# Patient Record
Sex: Female | Born: 1950 | Race: White | Hispanic: No | Marital: Married | Smoking: Never smoker
Health system: Southern US, Community
[De-identification: ages and names within clinical notes are randomized; demographics above are authoritative.]

## PROBLEM LIST (undated history)

## (undated) DIAGNOSIS — N3946 Mixed incontinence: Secondary | ICD-10-CM

## (undated) DIAGNOSIS — I10 Essential (primary) hypertension: Secondary | ICD-10-CM

## (undated) DIAGNOSIS — C439 Malignant melanoma of skin, unspecified: Secondary | ICD-10-CM

## (undated) DIAGNOSIS — K219 Gastro-esophageal reflux disease without esophagitis: Secondary | ICD-10-CM

## (undated) DIAGNOSIS — M8580 Other specified disorders of bone density and structure, unspecified site: Secondary | ICD-10-CM

## (undated) DIAGNOSIS — D229 Melanocytic nevi, unspecified: Secondary | ICD-10-CM

## (undated) HISTORY — DX: Essential (primary) hypertension: I10

## (undated) HISTORY — DX: Other specified disorders of bone density and structure, unspecified site: M85.80

## (undated) HISTORY — DX: Melanocytic nevi, unspecified: D22.9

## (undated) HISTORY — DX: Gastro-esophageal reflux disease without esophagitis: K21.9

## (undated) HISTORY — PX: TUBAL LIGATION: SHX77

## (undated) HISTORY — DX: Malignant melanoma of skin, unspecified: C43.9

## (undated) HISTORY — DX: Mixed incontinence: N39.46

---
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
## 1996-05-19 DIAGNOSIS — C439 Malignant melanoma of skin, unspecified: Secondary | ICD-10-CM

## 1996-05-19 HISTORY — PX: OTHER SURGICAL HISTORY: SHX169

## 1996-05-19 HISTORY — DX: Malignant melanoma of skin, unspecified: C43.9

## 2003-06-21 ENCOUNTER — Ambulatory Visit (HOSPITAL_COMMUNITY): Admission: RE | Admit: 2003-06-21 | Discharge: 2003-06-21 | Payer: Self-pay | Admitting: General Surgery

## 2004-01-31 ENCOUNTER — Encounter: Admission: RE | Admit: 2004-01-31 | Discharge: 2004-01-31 | Payer: Self-pay | Admitting: General Surgery

## 2005-02-19 ENCOUNTER — Encounter: Admission: RE | Admit: 2005-02-19 | Discharge: 2005-02-19 | Payer: Self-pay | Admitting: Obstetrics and Gynecology

## 2006-02-25 ENCOUNTER — Encounter: Admission: RE | Admit: 2006-02-25 | Discharge: 2006-02-25 | Payer: Self-pay | Admitting: General Surgery

## 2006-06-03 ENCOUNTER — Other Ambulatory Visit: Admission: RE | Admit: 2006-06-03 | Discharge: 2006-06-03 | Payer: Self-pay | Admitting: Obstetrics & Gynecology

## 2007-03-10 ENCOUNTER — Encounter: Admission: RE | Admit: 2007-03-10 | Discharge: 2007-03-10 | Payer: Self-pay | Admitting: Obstetrics & Gynecology

## 2007-07-07 ENCOUNTER — Other Ambulatory Visit: Admission: RE | Admit: 2007-07-07 | Discharge: 2007-07-07 | Payer: Self-pay | Admitting: Obstetrics & Gynecology

## 2008-03-15 ENCOUNTER — Encounter: Admission: RE | Admit: 2008-03-15 | Discharge: 2008-03-15 | Payer: Self-pay | Admitting: Obstetrics & Gynecology

## 2008-03-22 ENCOUNTER — Encounter: Admission: RE | Admit: 2008-03-22 | Discharge: 2008-03-22 | Payer: Self-pay | Admitting: Obstetrics & Gynecology

## 2008-03-29 ENCOUNTER — Encounter: Admission: RE | Admit: 2008-03-29 | Discharge: 2008-03-29 | Payer: Self-pay | Admitting: Obstetrics & Gynecology

## 2008-07-12 ENCOUNTER — Other Ambulatory Visit: Admission: RE | Admit: 2008-07-12 | Discharge: 2008-07-12 | Payer: Self-pay | Admitting: Obstetrics and Gynecology

## 2009-04-18 ENCOUNTER — Encounter: Admission: RE | Admit: 2009-04-18 | Discharge: 2009-04-18 | Payer: Self-pay | Admitting: Obstetrics & Gynecology

## 2009-05-19 HISTORY — PX: BREAST BIOPSY: SHX20

## 2009-07-04 ENCOUNTER — Encounter: Admission: RE | Admit: 2009-07-04 | Discharge: 2009-07-04 | Payer: Self-pay | Admitting: Obstetrics & Gynecology

## 2010-03-24 ENCOUNTER — Encounter: Admission: RE | Admit: 2010-03-24 | Discharge: 2010-03-24 | Payer: Self-pay | Admitting: Orthopedic Surgery

## 2010-06-08 ENCOUNTER — Other Ambulatory Visit: Payer: Self-pay | Admitting: Obstetrics & Gynecology

## 2010-06-08 DIAGNOSIS — Z1239 Encounter for other screening for malignant neoplasm of breast: Secondary | ICD-10-CM

## 2010-07-10 ENCOUNTER — Ambulatory Visit
Admission: RE | Admit: 2010-07-10 | Discharge: 2010-07-10 | Disposition: A | Discharge status: Home / Self Care | Payer: BC Managed Care – PPO | Source: Ambulatory Visit | Attending: Obstetrics & Gynecology | Admitting: Obstetrics & Gynecology

## 2010-07-10 DIAGNOSIS — Z1239 Encounter for other screening for malignant neoplasm of breast: Secondary | ICD-10-CM

## 2010-12-18 ENCOUNTER — Other Ambulatory Visit: Payer: Self-pay | Admitting: Dermatology

## 2011-05-20 LAB — HM PAP SMEAR: HM Pap smear: NORMAL

## 2011-10-09 ENCOUNTER — Other Ambulatory Visit: Payer: Self-pay | Admitting: Obstetrics & Gynecology

## 2011-10-09 DIAGNOSIS — Z1231 Encounter for screening mammogram for malignant neoplasm of breast: Secondary | ICD-10-CM

## 2011-11-26 ENCOUNTER — Ambulatory Visit
Admission: RE | Admit: 2011-11-26 | Discharge: 2011-11-26 | Disposition: A | Discharge status: Home / Self Care | Payer: BC Managed Care – PPO | Source: Ambulatory Visit | Attending: Obstetrics & Gynecology | Admitting: Obstetrics & Gynecology

## 2011-11-26 DIAGNOSIS — Z1231 Encounter for screening mammogram for malignant neoplasm of breast: Secondary | ICD-10-CM

## 2011-11-26 LAB — HM MAMMOGRAPHY: HM Mammogram: NORMAL

## 2012-08-23 ENCOUNTER — Encounter: Payer: Self-pay | Admitting: Internal Medicine

## 2012-08-23 ENCOUNTER — Ambulatory Visit (INDEPENDENT_AMBULATORY_CARE_PROVIDER_SITE_OTHER): Payer: BC Managed Care – PPO

## 2012-08-23 ENCOUNTER — Ambulatory Visit (INDEPENDENT_AMBULATORY_CARE_PROVIDER_SITE_OTHER)
Admission: RE | Admit: 2012-08-23 | Discharge: 2012-08-23 | Disposition: A | Discharge status: Home / Self Care | Payer: BC Managed Care – PPO | Source: Ambulatory Visit | Attending: Internal Medicine | Admitting: Internal Medicine

## 2012-08-23 ENCOUNTER — Ambulatory Visit (INDEPENDENT_AMBULATORY_CARE_PROVIDER_SITE_OTHER): Payer: BC Managed Care – PPO | Admitting: Internal Medicine

## 2012-08-23 VITALS — BP 126/70 | HR 83 | Temp 97.6°F | Resp 16 | Ht 63.0 in | Wt 132.0 lb

## 2012-08-23 DIAGNOSIS — Z0000 Encounter for general adult medical examination without abnormal findings: Secondary | ICD-10-CM | POA: Insufficient documentation

## 2012-08-23 DIAGNOSIS — R05 Cough: Secondary | ICD-10-CM

## 2012-08-23 DIAGNOSIS — K219 Gastro-esophageal reflux disease without esophagitis: Secondary | ICD-10-CM | POA: Insufficient documentation

## 2012-08-23 DIAGNOSIS — R059 Cough, unspecified: Secondary | ICD-10-CM

## 2012-08-23 LAB — COMPREHENSIVE METABOLIC PANEL
ALT: 15 U/L (ref 0–35)
AST: 15 U/L (ref 0–37)
Albumin: 4.2 g/dL (ref 3.5–5.2)
Alkaline Phosphatase: 44 U/L (ref 39–117)
BUN: 17 mg/dL (ref 6–23)
CO2: 26 mEq/L (ref 19–32)
Calcium: 9.1 mg/dL (ref 8.4–10.5)
Chloride: 105 mEq/L (ref 96–112)
Creatinine, Ser: 0.7 mg/dL (ref 0.4–1.2)
GFR: 97.51 mL/min (ref >60.00)
Glucose, Bld: 95 mg/dL (ref 70–99)
Potassium: 4 mEq/L (ref 3.5–5.1)
Sodium: 140 mEq/L (ref 135–145)
Total Bilirubin: 0.6 mg/dL (ref 0.3–1.2)
Total Protein: 7.6 g/dL (ref 6.0–8.3)

## 2012-08-23 LAB — URINALYSIS, ROUTINE W REFLEX MICROSCOPIC
Bilirubin Urine: NEGATIVE
Ketones, ur: NEGATIVE
Leukocytes, UA: NEGATIVE
Nitrite: NEGATIVE
Specific Gravity, Urine: 1.01 (ref 1.000–1.030)
Total Protein, Urine: NEGATIVE
Urine Glucose: NEGATIVE
Urobilinogen, UA: 0.2 (ref 0.0–1.0)
pH: 6 (ref 5.0–8.0)

## 2012-08-23 LAB — CBC WITH DIFFERENTIAL/PLATELET
Basophils Absolute: 0.1 10*3/uL (ref 0.0–0.1)
Basophils Relative: 1.2 % (ref 0.0–3.0)
Eosinophils Absolute: 0.2 10*3/uL (ref 0.0–0.7)
Eosinophils Relative: 3.3 % (ref 0.0–5.0)
HCT: 41.8 % (ref 36.0–46.0)
Hemoglobin: 14.6 g/dL (ref 12.0–15.0)
Lymphocytes Relative: 30.6 % (ref 12.0–46.0)
Lymphs Abs: 2.1 10*3/uL (ref 0.7–4.0)
MCHC: 35 g/dL (ref 30.0–36.0)
MCV: 89.4 fl (ref 78.0–100.0)
Monocytes Absolute: 0.7 10*3/uL (ref 0.1–1.0)
Monocytes Relative: 10.6 % (ref 3.0–12.0)
Neutro Abs: 3.8 10*3/uL (ref 1.4–7.7)
Neutrophils Relative %: 54.3 % (ref 43.0–77.0)
Platelets: 245 10*3/uL (ref 150.0–400.0)
RBC: 4.68 Mil/uL (ref 3.87–5.11)
RDW: 12.7 % (ref 11.5–14.6)
WBC: 7 10*3/uL (ref 4.5–10.5)

## 2012-08-23 LAB — LIPID PANEL
Cholesterol: 213 mg/dL — ABNORMAL HIGH (ref 0–200)
HDL: 56.5 mg/dL (ref >39.00)
Total CHOL/HDL Ratio: 4
Triglycerides: 132 mg/dL (ref 0.0–149.0)
VLDL: 26.4 mg/dL (ref 0.0–40.0)

## 2012-08-23 LAB — TSH: TSH: 1.52 u[IU]/mL (ref 0.35–5.50)

## 2012-08-23 MED ORDER — DEXLANSOPRAZOLE 60 MG PO CPDR: 60.0000 mg | DELAYED_RELEASE_CAPSULE | Freq: Every day | ORAL | Status: DC

## 2012-08-23 NOTE — Assessment & Plan Note (Signed)
I think this is caused by the GERD Will check her CXR today to look for mass, pna, etc.

## 2012-08-23 NOTE — Assessment & Plan Note (Signed)
She has a significant amount of GERD s/s so I have asked her to start dexilant I have also asked her to see GI to consider upper endoscopy to screen for Barrett's/stricture

## 2012-08-23 NOTE — Patient Instructions (Signed)

## 2012-08-23 NOTE — Progress Notes (Signed)
Subjective:    Patient ID: Jamie Duke, female    DOB: Apr 28, 1951, 62 y.o.   MRN: [id number]  Cough This is a chronic problem. Episode onset: for 4 months. The problem has been unchanged. The problem occurs every few hours. The cough is non-productive. Associated symptoms include heartburn. Pertinent negatives include no chest pain, chills, ear congestion, ear pain, fever, headaches, hemoptysis, myalgias, nasal congestion, postnasal drip, rash, rhinorrhea, sore throat, shortness of breath, sweats, weight loss or wheezing. She has tried nothing for the symptoms.  Gastrophageal Reflux She complains of coughing, heartburn, a hoarse voice and water brash. She reports no abdominal pain, no belching, no chest pain, no choking, no dysphagia, no early satiety, no globus sensation, no nausea, no sore throat, no stridor, no tooth decay or no wheezing. This is a chronic problem. The current episode started more than 1 year ago. The problem occurs constantly. The problem has been gradually worsening. The heartburn duration is several minutes. The heartburn is located in the substernum. The heartburn is of moderate intensity. The heartburn does not wake her from sleep. The heartburn does not limit her activity. The heartburn doesn't change with position. Pertinent negatives include no fatigue or weight loss. There are no known risk factors. She has tried an antacid (tums) for the symptoms. The treatment provided moderate relief.      Review of Systems  Constitutional: Negative.  Negative for fever, chills, weight loss, diaphoresis, activity change, appetite change, fatigue and unexpected weight change.  HENT: Positive for hoarse voice. Negative for ear pain, sore throat, rhinorrhea and postnasal drip.   Eyes: Negative.   Respiratory: Positive for cough. Negative for apnea, hemoptysis, choking, chest tightness, shortness of breath, wheezing and stridor.   Cardiovascular: Negative.  Negative for chest pain,  palpitations and leg swelling.  Gastrointestinal: Positive for heartburn. Negative for dysphagia, nausea, vomiting, abdominal pain, diarrhea, constipation, blood in stool, abdominal distention, anal bleeding and rectal pain.  Endocrine: Negative.   Genitourinary: Negative.   Musculoskeletal: Negative.  Negative for myalgias, back pain, joint swelling, arthralgias and gait problem.  Skin: Negative.  Negative for color change, pallor, rash and wound.  Allergic/Immunologic: Negative.   Neurological: Negative.  Negative for headaches.  Hematological: Negative.  Negative for adenopathy. Does not bruise/bleed easily.  Psychiatric/Behavioral: Negative.        Objective:   Physical Exam  Vitals reviewed. Constitutional: She is oriented to person, place, and time. She appears well-developed and well-nourished.  Non-toxic appearance. She does not have a sickly appearance. She does not appear ill. No distress.  HENT:  Head: Normocephalic and atraumatic.  Mouth/Throat: Oropharynx is clear and moist. No oropharyngeal exudate.  Eyes: Conjunctivae are normal. Right eye exhibits no discharge. Left eye exhibits no discharge. No scleral icterus.  Neck: Normal range of motion. Neck supple. Normal carotid pulses, no hepatojugular reflux and no JVD present. No tracheal tenderness present. Carotid bruit is not present. No tracheal deviation present. No mass and no thyromegaly present.  Cardiovascular: Normal rate, regular rhythm, normal heart sounds and intact distal pulses.  Exam reveals no gallop and no friction rub.   No murmur heard. Pulmonary/Chest: Effort normal and breath sounds normal. No stridor. No respiratory distress. She has no wheezes. She has no rales. She exhibits no tenderness.  Abdominal: Soft. Bowel sounds are normal. She exhibits no distension and no mass. There is no tenderness. There is no rebound and no guarding.  Musculoskeletal: Normal range of motion. She exhibits no edema and no  tenderness.  Lymphadenopathy:    She has no cervical adenopathy.  Neurological: She is oriented to person, place, and time.  Skin: Skin is warm and dry. No rash noted. She is not diaphoretic. No erythema. No pallor.  Psychiatric: She has a normal mood and affect. Her behavior is normal. Judgment and thought content normal.     No results found for this basename: WBC, HGB, HCT, PLT, GLUCOSE, CHOL, TRIG, HDL, LDLDIRECT, LDLCALC, ALT, AST, NA, K, CL, CREATININE, BUN, CO2, TSH, PSA, INR, GLUF, HGBA1C, MICROALBUR       Assessment & Plan:

## 2012-08-23 NOTE — Assessment & Plan Note (Signed)
She was referred to GI for a colonoscopy Exam done Vaccines were reviewed Labs ordered Pt ed material was given

## 2012-08-24 ENCOUNTER — Encounter: Payer: Self-pay | Admitting: Internal Medicine

## 2012-08-24 LAB — LDL CHOLESTEROL, DIRECT: Direct LDL: 128 mg/dL

## 2012-08-25 ENCOUNTER — Ambulatory Visit: Payer: BC Managed Care – PPO | Admitting: Internal Medicine

## 2012-08-31 ENCOUNTER — Encounter: Payer: Self-pay | Admitting: Internal Medicine

## 2012-09-29 ENCOUNTER — Ambulatory Visit (INDEPENDENT_AMBULATORY_CARE_PROVIDER_SITE_OTHER): Payer: BC Managed Care – PPO | Admitting: Internal Medicine

## 2012-09-29 ENCOUNTER — Encounter: Payer: Self-pay | Admitting: Internal Medicine

## 2012-09-29 VITALS — BP 130/80 | HR 72 | Ht 63.0 in | Wt 129.8 lb

## 2012-09-29 DIAGNOSIS — R059 Cough, unspecified: Secondary | ICD-10-CM

## 2012-09-29 DIAGNOSIS — J3489 Other specified disorders of nose and nasal sinuses: Secondary | ICD-10-CM

## 2012-09-29 DIAGNOSIS — Z1211 Encounter for screening for malignant neoplasm of colon: Secondary | ICD-10-CM

## 2012-09-29 DIAGNOSIS — R05 Cough: Secondary | ICD-10-CM

## 2012-09-29 DIAGNOSIS — R6889 Other general symptoms and signs: Secondary | ICD-10-CM

## 2012-09-29 DIAGNOSIS — R12 Heartburn: Secondary | ICD-10-CM

## 2012-09-29 MED ORDER — NA SULFATE-K SULFATE-MG SULF 17.5-3.13-1.6 GM/177ML PO SOLN: ORAL | Status: DC

## 2012-09-29 NOTE — Patient Instructions (Addendum)
You have been scheduled for a colonoscopy with propofol. Please follow written instructions given to you at your visit today.  Please pick up your prep kit at the pharmacy within the next 1-3 days. If you use inhalers (even only as needed), please bring them with you on the day of your procedure. Your physician has requested that you go to www.startemmi.com and enter the access code given to you at your visit today. This web site gives a general overview about your procedure. However, you should still follow specific instructions given to you by our office regarding your preparation for the procedure.  We have set you up an appointment to see [facility], Dr. Lazarus Salines on May 28th at 3pm.  They are located at [address], [address], [place].   Thank you for choosing me and  Gastroenterology.  Iva Boop, M.D., [facility]

## 2012-09-30 ENCOUNTER — Encounter: Payer: Self-pay | Admitting: Internal Medicine

## 2012-09-30 NOTE — Progress Notes (Signed)
Subjective:    Patient ID: Jamie Duke, female    DOB: 07/26/1950, 62 y.o.   MRN: [id number]  HPI This very nice lady is here to discuss a chronic cough and throat clearing that is thought possibly to be due to GERD. She recently saw Dr. Sanda Linger, and tells me she thought he was an ENT specialist. She was prescribed Dexilant but it nauseated her so she stopped it.  She has a dry cough that starts after she gets a "tickle". It has been a problem for months to over a year. In fact a year ago she started taking a daily Claritin OTC for allergies and chronic rhinorrhea. It helped but she still has some rhinorrhea. She denies post-nasal drip. No sleep disturbance from symptoms. No sore throat but does have some nasal congestion.  She does not have significant classic GERD sxs like heartburn. No dysphagia.  Does get a lump in her throat sensation.  No Known Allergies Outpatient Prescriptions Prior to Visit  Medication Sig Dispense Refill  . Cholecalciferol (VITAMIN D) 2000 UNITS CAPS Take by mouth 2 (two) times a week.      Marland Kitchen Conj Estrog-Medroxyprogest Ace (PREMPRO PO) Take 0.03 mg by mouth daily.       Marland Kitchen dexlansoprazole (DEXILANT) 60 MG capsule Take 1 capsule (60 mg total) by mouth daily.  90 capsule  3   No facility-administered medications prior to visit.   Past Medical History  Diagnosis Date  . GERD (gastroesophageal reflux disease)    Past Surgical History  Procedure Laterality Date  . Appendectomy  1965  . Tonsillectomy  1955  . Cesarean section    . Tubal ligation     History   Social History  . Marital Status: Married    Spouse Name: N/A    Number of Children: 1       Occupational History  . [employer]   Social History Main Topics  . Smoking status: Never Smoker   . Smokeless tobacco: Never Used  . Alcohol Use: No  . Drug Use: No  . Sexually Active: Not Currently    Family History  Problem Relation Age of Onset  . Heart disease Maternal Uncle    . Hypertension Mother   . Kidney disease Mother   . Lung cancer Father   . Lung cancer Brother    Review of Systems All other ROS negative or as per HPI    Objective:   Physical Exam General:  Well-developed, well-nourished and in no acute distress Eyes:  anicteric. ENT:   Mouth and posterior pharynx free of lesions.  Neck:   supple w/o thyromegaly or mass.  Lungs: Clear to auscultation bilaterally. Heart:  S1S2, no rubs, murmurs, gallops. Abdomen:  soft, non-tender, no hepatosplenomegaly, hernia, or mass and BS+.  Lymph:  no cervical or supraclavicular adenopathy. Extremities:   no edema Skin   no rash. Neuro:  A&O x 3.  Psych:  appropriate mood and  Affect.  Data Reviewed: Internal Medicine note and labs from 08/2012     Assessment & Plan:  Cough - Plan: Ambulatory referral to ENT  Chronic throat clearing - Plan: Ambulatory referral to ENT  Rhinorrhea - Plan: Ambulatory referral to ENT  Heartburn  Special screening for malignant neoplasms, colon - Plan: colonoscopy The risks and benefits as well as alternatives of endoscopic procedure(s) have been discussed and reviewed. All questions answered. The patient agrees to proceed.   1. I think her symptoms may be  allergies and not driven by GERD. Suspect she could have some GERD/heartburn but with the rhinorrhea and other sxs would have an ENT evaluation before any trial of PPI. I explained that might still be reasonable but would await ENT evaluation. Since Claritin has helped some may need more Tx from that direction. I do not think an EGD would help elucidate etiology. 2. Screening colonoscopy is appropriate and she will schedule this. 3. Note that she considers Dr. Fara Chute her PCP and was under the erroneous impression that Dr. Yetta Barre was an ENT provider.  Cc: Sanda Linger, MD and Flo Shanks, MD

## 2012-11-10 ENCOUNTER — Encounter: Payer: Self-pay | Admitting: Internal Medicine

## 2012-11-10 ENCOUNTER — Ambulatory Visit (AMBULATORY_SURGERY_CENTER): Payer: BC Managed Care – PPO | Admitting: Internal Medicine

## 2012-11-10 VITALS — BP 135/89 | HR 71 | Temp 98.8°F | Resp 17 | Ht 63.0 in | Wt 129.0 lb

## 2012-11-10 DIAGNOSIS — Z1211 Encounter for screening for malignant neoplasm of colon: Secondary | ICD-10-CM

## 2012-11-10 MED ORDER — SODIUM CHLORIDE 0.9 % IV SOLN: 500.0000 mL | INTRAVENOUS | Status: DC

## 2012-11-10 NOTE — Op Note (Addendum)
[facility] [address] [place], [address]   COLONOSCOPY PROCEDURE REPORT  PATIENT: Jamie Duke, Jamie Duke  MR#: [id number] BIRTHDATE: [birth year] , 62  yrs. old GENDER: Female ENDOSCOPIST: Iva Boop, MD, [facility] REFERRED [id number] Neita Carp, M.D. PROCEDURE DATE:  11/10/2012 PROCEDURE:   Colonoscopy, screening ASA CLASS:   Class I INDICATIONS:average risk screening and first colonoscopy. MEDICATIONS: propofol (Diprivan) 200mg  IV, MAC sedation, administered by CRNA, and These medications were titrated to patient response per physician's verbal order  DESCRIPTION OF PROCEDURE:   After the risks benefits and alternatives of the procedure were thoroughly explained, informed consent was obtained.  A digital rectal exam revealed no abnormalities of the rectum.   The LB [id number]  endoscope was introduced through the anus and advanced to the cecum, which was identified by both the appendix and ileocecal valve. No adverse events experienced.   The quality of the prep was excellent using Suprep  The instrument was then slowly withdrawn as the colon was fully examined.      COLON FINDINGS: A normal appearing cecum, ileocecal valve, and appendiceal orifice were identified.  The ascending, hepatic flexure, transverse, splenic flexure, descending, sigmoid colon and rectum appeared unremarkable.  No polyps or cancers were seen.   A right colon retroflexion was performed.  Retroflexed views revealed no abnormalities. The time to cecum=3 minutes 13 seconds. Withdrawal time=7 minutes 40 seconds.  The scope was withdrawn and the procedure completed. COMPLICATIONS: There were no complications.  ENDOSCOPIC IMPRESSION: Normal colonoscopy, excellent prep  RECOMMENDATIONS: Repeat Colonscopy in 10 years - 2024   eSigned:  Iva Boop, MD, [facility] 11/10/2012 3:43 PM Revised: 11/10/2012 3:43 PM  cc: Fara Chute, MD, The Patient, and Leda Quail, MD

## 2012-11-10 NOTE — Progress Notes (Signed)
Patient did not experience any of the following events: a burn prior to discharge; a fall within the facility; wrong site/side/patient/procedure/implant event; or a hospital transfer or hospital admission upon discharge from the facility. (G8907) Patient did not have preoperative order for IV antibiotic SSI prophylaxis. (G8918)  

## 2012-11-10 NOTE — Patient Instructions (Addendum)
Repeat colonoscopy in 10 years.  YOU HAD AN ENDOSCOPIC PROCEDURE TODAY AT THE [facility]: Refer to the procedure report that was given to you for any specific questions about what was found during the examination.  If the procedure report does not answer your questions, please call your gastroenterologist to clarify.  If you requested that your care partner not be given the details of your procedure findings, then the procedure report has been included in a sealed envelope for you to review at your convenience later.  YOU SHOULD EXPECT: Some feelings of bloating in the abdomen. Passage of more gas than usual.  Walking can help get rid of the air that was put into your GI tract during the procedure and reduce the bloating. If you had a lower endoscopy (such as a colonoscopy or flexible sigmoidoscopy) you may notice spotting of blood in your stool or on the toilet paper. If you underwent a bowel prep for your procedure, then you may not have a normal bowel movement for a few days.  DIET: Your first meal following the procedure should be a light meal and then it is ok to progress to your normal diet.  A half-sandwich or bowl of soup is an example of a good first meal.  Heavy or fried foods are harder to digest and may make you feel nauseous or bloated.  Likewise meals heavy in dairy and vegetables can cause extra gas to form and this can also increase the bloating.  Drink plenty of fluids but you should avoid alcoholic beverages for 24 hours.  ACTIVITY: Your care partner should take you home directly after the procedure.  You should plan to take it easy, moving slowly for the rest of the day.  You can resume normal activity the day after the procedure however you should NOT DRIVE or use heavy machinery for 24 hours (because of the sedation medicines used during the test).    SYMPTOMS TO REPORT IMMEDIATELY: A gastroenterologist can be reached at any hour.  During normal business hours, 8:30 AM  to 5:00 PM Monday through Friday, call [phone].  After hours and on weekends, please call the GI answering service at [phone] who will take a message and have the physician on call contact you.   Following lower endoscopy (colonoscopy or flexible sigmoidoscopy):  Excessive amounts of blood in the stool  Significant tenderness or worsening of abdominal pains  Swelling of the abdomen that is new, acute  Fever of 100F or higher  FOLLOW UP: If any biopsies were taken you will be contacted by phone or by letter within the next 1-3 weeks.  Call your gastroenterologist if you have not heard about the biopsies in 3 weeks.  Our staff will call the home number listed on your records the next business day following your procedure to check on you and address any questions or concerns that you may have at that time regarding the information given to you following your procedure. This is a courtesy call and so if there is no answer at the home number and we have not heard from you through the emergency physician on call, we will assume that you have returned to your regular daily activities without incident.  SIGNATURES/CONFIDENTIALITY: You and/or your care partner have signed paperwork which will be entered into your electronic medical record.  These signatures attest to the fact that that the information above on your After Visit Summary has been reviewed and is understood.  Full  responsibility of the confidentiality of this discharge information lies with you and/or your care-partner.

## 2012-11-11 ENCOUNTER — Telehealth: Payer: Self-pay | Admitting: *Deleted

## 2012-11-11 NOTE — Telephone Encounter (Signed)
  Follow up Call-  Call back number 11/10/2012  Post procedure Call Back phone  # [phone]  Permission to leave phone message Yes     Patient questions:  Do you have a fever, pain , or abdominal swelling? no Pain Score  0 *  Have you tolerated food without any problems? yes  Have you been able to return to your normal activities? yes  Do you have any questions about your discharge instructions: Diet   no Medications  no Follow up visit  no  Do you have questions or concerns about your Care? no  Actions: * If pain score is 4 or above: No action needed, pain <4.

## 2012-12-02 ENCOUNTER — Other Ambulatory Visit: Payer: Self-pay

## 2012-12-02 DIAGNOSIS — Z1231 Encounter for screening mammogram for malignant neoplasm of breast: Secondary | ICD-10-CM

## 2012-12-29 ENCOUNTER — Ambulatory Visit
Admission: RE | Admit: 2012-12-29 | Discharge: 2012-12-29 | Disposition: A | Discharge status: Home / Self Care | Payer: BC Managed Care – PPO | Source: Ambulatory Visit

## 2012-12-29 DIAGNOSIS — Z1231 Encounter for screening mammogram for malignant neoplasm of breast: Secondary | ICD-10-CM

## 2013-02-11 ENCOUNTER — Encounter: Payer: Self-pay | Admitting: Obstetrics & Gynecology

## 2013-02-14 ENCOUNTER — Encounter: Payer: Self-pay | Admitting: Obstetrics & Gynecology

## 2013-02-14 ENCOUNTER — Ambulatory Visit: Payer: Self-pay | Admitting: Obstetrics & Gynecology

## 2013-02-14 ENCOUNTER — Ambulatory Visit (INDEPENDENT_AMBULATORY_CARE_PROVIDER_SITE_OTHER): Payer: BC Managed Care – PPO | Admitting: Obstetrics & Gynecology

## 2013-02-14 VITALS — BP 132/84 | HR 64 | Resp 12 | Ht 63.5 in | Wt 131.8 lb

## 2013-02-14 DIAGNOSIS — M8580 Other specified disorders of bone density and structure, unspecified site: Secondary | ICD-10-CM

## 2013-02-14 DIAGNOSIS — Z0000 Encounter for general adult medical examination without abnormal findings: Secondary | ICD-10-CM

## 2013-02-14 DIAGNOSIS — Z01419 Encounter for gynecological examination (general) (routine) without abnormal findings: Secondary | ICD-10-CM

## 2013-02-14 DIAGNOSIS — M899 Disorder of bone, unspecified: Secondary | ICD-10-CM

## 2013-02-14 LAB — COMPREHENSIVE METABOLIC PANEL
ALT: 12 U/L (ref 0–35)
AST: 13 U/L (ref 0–37)
Albumin: 4.5 g/dL (ref 3.5–5.2)
Alkaline Phosphatase: 45 U/L (ref 39–117)
BUN: 13 mg/dL (ref 6–23)
CO2: 29 mEq/L (ref 19–32)
Calcium: 9.5 mg/dL (ref 8.4–10.5)
Chloride: 103 mEq/L (ref 96–112)
Creat: 0.65 mg/dL (ref 0.50–1.10)
Glucose, Bld: 79 mg/dL (ref 70–99)
Potassium: 4.1 mEq/L (ref 3.5–5.3)
Sodium: 140 mEq/L (ref 135–145)
Total Bilirubin: 0.5 mg/dL (ref 0.3–1.2)
Total Protein: 7.3 g/dL (ref 6.0–8.3)

## 2013-02-14 LAB — POCT URINALYSIS DIPSTICK
Bilirubin, UA: NEGATIVE
Glucose, UA: NEGATIVE
Ketones, UA: NEGATIVE
Nitrite, UA: NEGATIVE
Protein, UA: NEGATIVE
Urobilinogen, UA: NEGATIVE
pH, UA: 7

## 2013-02-14 LAB — LIPID PANEL
Cholesterol: 204 mg/dL — ABNORMAL HIGH (ref 0–200)
HDL: 57 mg/dL (ref >39)
LDL Cholesterol: 113 mg/dL — ABNORMAL HIGH (ref 0–99)
Total CHOL/HDL Ratio: 3.6 Ratio
Triglycerides: 172 mg/dL — ABNORMAL HIGH (ref <150)
VLDL: 34 mg/dL (ref 0–40)

## 2013-02-14 LAB — HEMOGLOBIN, FINGERSTICK: Hemoglobin, fingerstick: 14.7 g/dL (ref 12.0–16.0)

## 2013-02-14 MED ORDER — VITAMIN D (ERGOCALCIFEROL) 1.25 MG (50000 UNIT) PO CAPS: 50000.0000 [IU] | ORAL_CAPSULE | ORAL | Status: DC

## 2013-02-14 MED ORDER — CONJ ESTROG-MEDROXYPROGEST ACE 0.3-1.5 MG PO TABS: 1.0000 | ORAL_TABLET | Freq: Every day | ORAL | Status: DC

## 2013-02-14 NOTE — Progress Notes (Signed)
62 y.o. G2P1 MarriedCaucasianF here for annual exam.  Doing well.  No bleeding.    Patient's last menstrual period was 05/19/1990.          Sexually active: no  The current method of family planning is none.    Exercising: yes  At work Smoker:  no  Health Maintenance: Pap:  01/28/12 WNL/negative HR HPV History of abnormal Pap:  no MMG:  12/29/12 3D normal Colonoscopy:  2014 repeat in 10 years, Dr Leone Payor BMD:   2009 (-1.6/-1.3) TDaP:  1/08 Screening Labs: today, Hb today: 14.7, Urine today: WBC-trace, RBC-trace, PH-7.0   reports that she has never smoked. She has never used smokeless tobacco. She reports that she does not drink alcohol or use illicit drugs.  Past Medical History  Diagnosis Date  . GERD (gastroesophageal reflux disease)   . Osteopenia   . Atypical moles   . Mixed incontinence   . Melanoma     Past Surgical History  Procedure Laterality Date  . Appendectomy  1965  . Tonsillectomy  1955  . Cesarean section    . Tubal ligation    . Excision of melanoma    . Breast biopsy      fibrocystic changes    Current Outpatient Prescriptions  Medication Sig Dispense Refill  . b complex vitamins tablet Take 1 tablet by mouth daily.      . Cholecalciferol (VITAMIN D) 2000 UNITS CAPS Take by mouth 2 (two) times a week.      . estrogen, conjugated,-medroxyprogesterone (PREMPRO) 0.3-1.5 MG per tablet Take 1 tablet by mouth daily.      . Loratadine (CLARITIN PO) Take 1 capsule by mouth.       No current facility-administered medications for this visit.    Family History  Problem Relation Age of Onset  . Heart disease Maternal Uncle   . Hypertension Mother   . Kidney disease Mother   . Lung cancer Father   . Lung cancer Brother   . Melanoma Brother   . Transient ischemic attack Mother     ROS:  Pertinent items are noted in HPI.  Otherwise, a comprehensive ROS was negative.  Exam:   BP 132/84  Pulse 64  Resp 12  Ht 5' 3.5" (1.613 m)  Wt 131 lb 12.8 oz  (59.784 kg)  BMI 22.98 kg/m2  LMP 05/19/1990  Weight change:+1lbs  Height: 5' 3.5" (161.3 cm)  Ht Readings from Last 3 Encounters:  02/14/13 5' 3.5" (1.613 m)  11/10/12 5\' 3"  (1.6 m)  09/29/12 5\' 3"  (1.6 m)    General appearance: alert, cooperative and appears stated age Head: Normocephalic, without obvious abnormality, atraumatic Neck: no adenopathy, supple, symmetrical, trachea midline and thyroid normal to inspection and palpation Lungs: clear to auscultation bilaterally Breasts: normal appearance, no masses or tenderness Heart: regular rate and rhythm Abdomen: soft, non-tender; bowel sounds normal; no masses,  no organomegaly Extremities: extremities normal, atraumatic, no cyanosis or edema Skin: Skin color, texture, turgor normal. No rashes or lesions Lymph nodes: Cervical, supraclavicular, and axillary nodes normal. No abnormal inguinal nodes palpated Neurologic: Grossly normal   Pelvic: External genitalia:  no lesions              Urethra:  normal appearing urethra with no masses, tenderness or lesions              Bartholins and Skenes: normal                 Vagina: normal appearing  vagina with normal color and discharge, no lesions              Cervix: no lesions              Pap taken: no Bimanual Exam:  Uterus:  normal size, contour, position, consistency, mobility, non-tender              Adnexa: normal adnexa and no mass, fullness, tenderness               Rectovaginal: Confirms               Anus:  normal sphincter tone, no lesions  A:  Well Woman with normal exam PMP, on low dose HRT H/O melanoma Low Vit D Osteopenia  P:   Mammogram yearly.  D/W pt 3D MMG. pap smear with neg HR HPV 9/13 Prem/pro 0.3/1.5mg  to pharmacy for year. Vit D, CMP, Lipids today On Vit D 50K every 14 days.  RX to pharmacy.  Will change if needed. return annually or prn  An After Visit Summary was printed and given to the patient.

## 2013-02-14 NOTE — Patient Instructions (Signed)

## 2013-02-15 LAB — VITAMIN D 25 HYDROXY (VIT D DEFICIENCY, FRACTURES): Vit D, 25-Hydroxy: 32 ng/mL (ref 30–89)

## 2013-03-30 ENCOUNTER — Ambulatory Visit
Admission: RE | Admit: 2013-03-30 | Discharge: 2013-03-30 | Disposition: A | Discharge status: Home / Self Care | Payer: BC Managed Care – PPO | Source: Ambulatory Visit | Attending: Obstetrics & Gynecology | Admitting: Obstetrics & Gynecology

## 2013-03-30 DIAGNOSIS — M8580 Other specified disorders of bone density and structure, unspecified site: Secondary | ICD-10-CM

## 2013-09-28 ENCOUNTER — Telehealth: Payer: Self-pay | Admitting: Emergency Medicine

## 2013-09-28 NOTE — Telephone Encounter (Signed)
Called patient to advise we would initiate prior authorization for prempro. Patient has new [employer] Coverage phone is [phone].

## 2013-12-02 ENCOUNTER — Other Ambulatory Visit: Payer: Self-pay

## 2013-12-02 DIAGNOSIS — Z1231 Encounter for screening mammogram for malignant neoplasm of breast: Secondary | ICD-10-CM

## 2014-01-04 ENCOUNTER — Ambulatory Visit
Admission: RE | Admit: 2014-01-04 | Discharge: 2014-01-04 | Disposition: A | Discharge status: Home / Self Care | Payer: Medicare Other | Source: Ambulatory Visit

## 2014-01-04 DIAGNOSIS — Z1231 Encounter for screening mammogram for malignant neoplasm of breast: Secondary | ICD-10-CM

## 2014-01-06 ENCOUNTER — Other Ambulatory Visit: Payer: Self-pay | Admitting: Obstetrics & Gynecology

## 2014-01-06 DIAGNOSIS — R928 Other abnormal and inconclusive findings on diagnostic imaging of breast: Secondary | ICD-10-CM

## 2014-01-18 ENCOUNTER — Ambulatory Visit
Admission: RE | Admit: 2014-01-18 | Discharge: 2014-01-18 | Disposition: A | Discharge status: Home / Self Care | Payer: Medicare Other | Source: Ambulatory Visit | Attending: Obstetrics & Gynecology | Admitting: Obstetrics & Gynecology

## 2014-01-18 DIAGNOSIS — R928 Other abnormal and inconclusive findings on diagnostic imaging of breast: Secondary | ICD-10-CM

## 2014-02-26 ENCOUNTER — Other Ambulatory Visit: Payer: Self-pay | Admitting: Obstetrics & Gynecology

## 2014-02-27 NOTE — Telephone Encounter (Signed)
Last AEX: 02/14/13 Last refill:Prempro-02/14/13 #30 X 12, Vitamin D-02/14/13 #6 X 4 Current AEX:03/30/14 Last Vitamin D: 02/14/13 32 Last MMG: 01/18/14 Bi-Rads Benign  Please advise

## 2014-03-20 ENCOUNTER — Encounter: Payer: Self-pay | Admitting: Obstetrics & Gynecology

## 2014-03-30 ENCOUNTER — Ambulatory Visit (INDEPENDENT_AMBULATORY_CARE_PROVIDER_SITE_OTHER): Payer: Medicare Other | Admitting: Obstetrics & Gynecology

## 2014-03-30 ENCOUNTER — Encounter: Payer: Self-pay | Admitting: Obstetrics & Gynecology

## 2014-03-30 VITALS — BP 132/88 | HR 60 | Ht 63.5 in | Wt 129.0 lb

## 2014-03-30 DIAGNOSIS — Z124 Encounter for screening for malignant neoplasm of cervix: Secondary | ICD-10-CM

## 2014-03-30 DIAGNOSIS — Z01419 Encounter for gynecological examination (general) (routine) without abnormal findings: Secondary | ICD-10-CM

## 2014-03-30 MED ORDER — NORETHINDRONE-ETH ESTRADIOL 1-5 MG-MCG PO TABS: 1.0000 | ORAL_TABLET | Freq: Every day | ORAL | Status: DC

## 2014-03-30 MED ORDER — VITAMIN D (ERGOCALCIFEROL) 1.25 MG (50000 UNIT) PO CAPS: ORAL_CAPSULE | ORAL | Status: DC

## 2014-03-30 NOTE — Progress Notes (Addendum)
63 y.o. G2P1 MarriedCaucasianF here for annual exam.  Having worsening hip and right upper leg pain.  Needs to see ortho but not sure who to go to.  Stands all day.  Worse at night.  Wakes her up at times.  Feels it does hurt during the day but is doing other things so not focused on it.  Reports worse after extended walking.  No vaginal bleeding.    Patient's last menstrual period was 05/19/1990.          Sexually active: No.  The current method of family planning is tubal ligation.    Exercising: Yes.    Home exercise routine includes walking during work daily. Smoker:  no  Health Maintenance: Pap:  01/28/12 WNL/neg HR HPV History of abnormal Pap:  no MMG:  01/04/14 incomplete, 01/18/14 [place] Left breast Bi-rads Benign Colonoscopy:  2014 repeat in 10 years, Dr. Carlean Purl BMD:   2009 (-1.6/-1.3) TDaP:  05/2006 Screening Labs: 2014.  Vit was 32.   reports that she has never smoked. She has never used smokeless tobacco. She reports that she does not drink alcohol or use illicit drugs.  Past Medical History  Diagnosis Date  . GERD (gastroesophageal reflux disease)   . Osteopenia   . Atypical moles   . Mixed incontinence   . Melanoma 1998    Past Surgical History  Procedure Laterality Date  . Appendectomy  1965  . Tonsillectomy  1955  . Cesarean section    . Tubal ligation    . Excision of melanoma  1998    Dr Jon Gills  . Breast biopsy      fibrocystic changes    Current Outpatient Prescriptions  Medication Sig Dispense Refill  . b complex vitamins tablet Take 1 tablet by mouth daily.    . Loratadine (CLARITIN PO) Take 1 capsule by mouth.    Marland Kitchen PREMPRO 0.3-1.5 MG per tablet TAKE 1 TABLET BY MOUTH DAILY 28 tablet 1  . Vitamin D, Ergocalciferol, (DRISDOL) 50000 UNITS CAPS capsule TAKE 1 CAPSULE BY MOUTH EVERY 14 DAYS 6 capsule 1   No current facility-administered medications for this visit.    Family History  Problem Relation Age of Onset  . Heart disease Maternal Uncle   .  Hypertension Mother   . Kidney disease Mother   . Lung cancer Father   . Lung cancer Brother   . Melanoma Brother   . Transient ischemic attack Mother     ROS:  Pertinent items are noted in HPI.  Otherwise, a comprehensive ROS was negative.  Exam:   BP 132/88 mmHg  Pulse 60  Ht 5' 3.5" (1.613 m)  Wt 129 lb (58.514 kg)  BMI 22.49 kg/m2  LMP 05/19/1990  Weight change: -2#   Height: 5' 3.5" (161.3 cm)  Ht Readings from Last 3 Encounters:  03/30/14 5' 3.5" (1.613 m)  02/14/13 5' 3.5" (1.613 m)  11/10/12 5\' 3"  (1.6 m)    General appearance: alert, cooperative and appears stated age Head: Normocephalic, without obvious abnormality, atraumatic Neck: no adenopathy, supple, symmetrical, trachea midline and thyroid normal to inspection and palpation Lungs: clear to auscultation bilaterally Breasts: normal appearance, no masses or tenderness Heart: regular rate and rhythm Abdomen: soft, non-tender; bowel sounds normal; no masses,  no organomegaly Extremities: extremities normal, atraumatic, no cyanosis or edema Skin: Skin color, texture, turgor normal. No rashes or lesions Lymph nodes: Cervical, supraclavicular, and axillary nodes normal. No abnormal inguinal nodes palpated Neurologic: Grossly normal   Pelvic: External  genitalia:  no lesions              Urethra:  normal appearing urethra with no masses, tenderness or lesions              Bartholins and Skenes: normal                 Vagina: normal appearing vagina with normal color and discharge, no lesions              Cervix: no lesions              Pap taken: Yes.   Bimanual Exam:  Uterus:  normal size, contour, position, consistency, mobility, non-tender              Adnexa: normal adnexa and no mass, fullness, tenderness               Rectovaginal: Confirms               Anus:  normal sphincter tone, no lesions  A:  Well Woman with normal exam PMP, on low dose HRT H/O melanoma Low Vit D Osteopenia Right hip pain  P:  Mammogram yearly. D/W pt 3D MMG. pap smear with neg HR HPV 9/13 Switch to [facility] (generic) 1/74mcg daily.  Pt will cut in 1/2 Labs last year On Vit D 50K every 14 days. RX to pharmacy. Level 32 last year. BMD if covered by insurance Information given regarding Dr. Lynann Bologna.  Pt will call if needs referral. return annually or prn An After Visit Summary was printed and given to the patient.

## 2014-03-30 NOTE — Patient Instructions (Addendum)
Almedia Balls, MD [address], [place], [place] [address] [phone]  Check about coverage for your BMD.  I'd like you to do one this year.

## 2014-03-31 LAB — IPS PAP TEST WITH REFLEX TO HPV

## 2014-04-29 ENCOUNTER — Other Ambulatory Visit: Payer: Self-pay | Admitting: Obstetrics & Gynecology

## 2014-05-01 NOTE — Telephone Encounter (Signed)
03/30/14 at AEX patient was switched to Femhrt 1/5 mg, patient is no longer on this medication.

## 2014-05-03 ENCOUNTER — Other Ambulatory Visit: Payer: Self-pay | Admitting: Obstetrics & Gynecology

## 2014-05-03 NOTE — Telephone Encounter (Signed)
Pt is no longer on rx. Rx was D/C on 03/30/14

## 2014-05-23 ENCOUNTER — Other Ambulatory Visit: Payer: Self-pay | Admitting: Obstetrics & Gynecology

## 2014-05-23 NOTE — Telephone Encounter (Signed)
Rx sent 03/30/14 #84tabs/ 4 Refills.   Called pharmacy talked with Barnett Applebaum and she states they have Rx on file but pt has a $88 copay for 3 month supply of FemHRT.   Pharmacy hasn't contacted [place] for this Rx, they sent request for Prempro which she is not on anymore bc Dr. Sabra Heck switched her to [facility].    Called pt and notified she verbalized understanding.

## 2014-05-23 NOTE — Telephone Encounter (Signed)
Patient is requesting refills of norethindrone-ethinyl estradiol (FEMHRT 1/5 Patient says her pharmacy has contacted [place] several times with no response. Patient needs this medication ASAP,  Confirmed pharmacy on file with patient.

## 2014-07-28 ENCOUNTER — Telehealth: Payer: Self-pay | Admitting: Obstetrics & Gynecology

## 2014-07-28 NOTE — Telephone Encounter (Signed)
Patient says Dr.Miller changed her prescription (due to prescription coverage) at her last visit 03/20/14. Patient says "this is not working for me, I would like to change back to the previous medication" Confirmed pharmacy on file.

## 2014-07-28 NOTE — Telephone Encounter (Signed)
Patient was seen 03/23/2014 with Dr.Miller for annual. Patient was changed from Prempro 0.3-1.5mg  to [facility] 1/53mcg half a tablet daily. Patient is requesting change back to Prempro at this time.  Dr.Miller, okay to switch back to Prempro until next aex?

## 2014-07-31 MED ORDER — CONJ ESTROG-MEDROXYPROGEST ACE 0.3-1.5 MG PO TABS: 1.0000 | ORAL_TABLET | Freq: Every day | ORAL | Status: DC

## 2014-07-31 NOTE — Telephone Encounter (Signed)
Prempro 0.3-1.5mg  #30 11RF sent to [facility] on file. FemHRT discontinued. Spoke with patient. Advised of Prempro being sent to pharmacy. Patient is agreeable.  Routing to provider for final review. Patient agreeable to disposition. Will close encounter

## 2014-07-31 NOTE — Telephone Encounter (Signed)
Yes.  Can do RFs for the entire year.  Thanks.

## 2015-01-12 ENCOUNTER — Other Ambulatory Visit: Payer: Self-pay

## 2015-01-12 DIAGNOSIS — Z1231 Encounter for screening mammogram for malignant neoplasm of breast: Secondary | ICD-10-CM

## 2015-02-21 ENCOUNTER — Ambulatory Visit
Admission: RE | Admit: 2015-02-21 | Discharge: 2015-02-21 | Disposition: A | Discharge status: Home / Self Care | Payer: Medicare Other | Source: Ambulatory Visit

## 2015-02-21 DIAGNOSIS — Z1231 Encounter for screening mammogram for malignant neoplasm of breast: Secondary | ICD-10-CM

## 2015-05-15 ENCOUNTER — Other Ambulatory Visit: Payer: Self-pay | Admitting: Obstetrics & Gynecology

## 2015-05-15 NOTE — Telephone Encounter (Signed)
Medication refill request: Vitamin D Last AEX:  03/30/14 SM Next AEX: 05/29/15 SM Last MMG (if hormonal medication request): 02/21/15 BIRADS1:neg Refill authorized: 03/30/14 #6caps/4R. Today please advise.

## 2015-05-29 ENCOUNTER — Ambulatory Visit: Payer: Medicare Other | Admitting: Obstetrics & Gynecology

## 2015-06-11 ENCOUNTER — Ambulatory Visit (INDEPENDENT_AMBULATORY_CARE_PROVIDER_SITE_OTHER): Payer: Medicare Other | Admitting: Obstetrics & Gynecology

## 2015-06-11 ENCOUNTER — Encounter: Payer: Self-pay | Admitting: Obstetrics & Gynecology

## 2015-06-11 VITALS — BP 138/86 | HR 70 | Resp 16 | Ht 63.25 in | Wt 126.0 lb

## 2015-06-11 DIAGNOSIS — E559 Vitamin D deficiency, unspecified: Secondary | ICD-10-CM | POA: Diagnosis not present

## 2015-06-11 DIAGNOSIS — Z124 Encounter for screening for malignant neoplasm of cervix: Secondary | ICD-10-CM | POA: Diagnosis not present

## 2015-06-11 DIAGNOSIS — Z01419 Encounter for gynecological examination (general) (routine) without abnormal findings: Secondary | ICD-10-CM

## 2015-06-11 DIAGNOSIS — Z23 Encounter for immunization: Secondary | ICD-10-CM | POA: Diagnosis not present

## 2015-06-11 DIAGNOSIS — E785 Hyperlipidemia, unspecified: Secondary | ICD-10-CM | POA: Diagnosis not present

## 2015-06-11 LAB — LIPID PANEL
Cholesterol: 208 mg/dL — ABNORMAL HIGH (ref 125–200)
HDL: 60 mg/dL (ref >=46)
LDL Cholesterol: 124 mg/dL (ref <130)
Total CHOL/HDL Ratio: 3.5 Ratio (ref <=5.0)
Triglycerides: 121 mg/dL (ref <150)
VLDL: 24 mg/dL (ref <30)

## 2015-06-11 LAB — COMPREHENSIVE METABOLIC PANEL
ALT: 14 U/L (ref 6–29)
AST: 12 U/L (ref 10–35)
Albumin: 4.6 g/dL (ref 3.6–5.1)
Alkaline Phosphatase: 52 U/L (ref 33–130)
BUN: 14 mg/dL (ref 7–25)
CO2: 26 mmol/L (ref 20–31)
Calcium: 9.3 mg/dL (ref 8.6–10.4)
Chloride: 103 mmol/L (ref 98–110)
Creat: 0.64 mg/dL (ref 0.50–0.99)
Glucose, Bld: 93 mg/dL (ref 65–99)
Potassium: 3.9 mmol/L (ref 3.5–5.3)
Sodium: 141 mmol/L (ref 135–146)
Total Bilirubin: 0.6 mg/dL (ref 0.2–1.2)
Total Protein: 7.3 g/dL (ref 6.1–8.1)

## 2015-06-11 MED ORDER — CONJ ESTROG-MEDROXYPROGEST ACE 0.3-1.5 MG PO TABS: 1.0000 | ORAL_TABLET | Freq: Every day | ORAL | Status: DC

## 2015-06-11 MED ORDER — VITAMIN D (ERGOCALCIFEROL) 1.25 MG (50000 UNIT) PO CAPS: 50000.0000 [IU] | ORAL_CAPSULE | ORAL | Status: DC

## 2015-06-11 NOTE — Progress Notes (Signed)
65 y.o. G2P1 MarriedCaucasianF here for annual exam.  Doing well.  Denies vaginal bleeding.  Hip pain has improved.    Declines getting pneumonia vaccine.  Wants tetanus today.  Has tried cutting Prempro in half and pt states "it doesn't work when I do that".  D/W pt she is not supposed to cut these in 1/2.    PCP:  Dr. Quintin Alto, in [place].  Doesn't go unless has a problem.    Patient's last menstrual period was 05/19/1990.          Sexually active: No.  The current method of family planning is abstinence and post menopausal status.    Exercising: Yes.   Smoker:  no  Health Maintenance: Pap:  03/30/14 Neg, 01/28/12 neg pap with neg HR HPV History of abnormal Pap:  no MMG:  02/21/15 BIRADS1:neg Colonoscopy:  11/10/12 Normal - repeat 10 years  BMD:   03/30/13 Low Bone Mass TDaP: done today Zostavax:  2014 Screening Labs: PCP, Hb today: PCP, Urine today: PCP   reports that she has never smoked. She has never used smokeless tobacco. She reports that she does not drink alcohol or use illicit drugs.  Past Medical History  Diagnosis Date  . GERD (gastroesophageal reflux disease)   . Osteopenia   . Atypical moles   . Mixed incontinence   . Melanoma ([place]) 1998    Past Surgical History  Procedure Laterality Date  . Appendectomy  1965  . Tonsillectomy  1955  . Cesarean section    . Tubal ligation    . Excision of melanoma  1998    Dr Jon Gills  . Breast biopsy      fibrocystic changes    Current Outpatient Prescriptions  Medication Sig Dispense Refill  . b complex vitamins tablet Take 1 tablet by mouth daily.    Marland Kitchen estrogen, conjugated,-medroxyprogesterone (PREMPRO) 0.3-1.5 MG per tablet Take 1 tablet by mouth daily. 30 tablet 11  . Loratadine (CLARITIN PO) Take 1 capsule by mouth.    . Vitamin D, Ergocalciferol, (DRISDOL) 50000 units CAPS capsule TAKE 1 CAPSULE BY MOUTH EVERY 14 DAYS 6 capsule 0   No current facility-administered medications for this visit.    Family History   Problem Relation Age of Onset  . Heart disease Maternal Uncle   . Hypertension Mother   . Kidney disease Mother   . Lung cancer Father   . Lung cancer Brother   . Melanoma Brother   . Transient ischemic attack Mother     ROS:  Pertinent items are noted in HPI.  Otherwise, a comprehensive ROS was negative.  Exam:   BP 138/86 mmHg  Pulse 70  Resp 16  Ht 5' 3.25" (1.607 m)  Wt 126 lb (57.153 kg)  BMI 22.13 kg/m2  LMP 05/19/1990  Weight change: +3#  Height: 5' 3.25" (160.7 cm)  Ht Readings from Last 3 Encounters:  06/11/15 5' 3.25" (1.607 m)  03/30/14 5' 3.5" (1.613 m)  02/14/13 5' 3.5" (1.613 m)   General appearance: alert, cooperative and appears stated age Head: Normocephalic, without obvious abnormality, atraumatic Neck: no adenopathy, supple, symmetrical, trachea midline and thyroid normal to inspection and palpation Lungs: clear to auscultation bilaterally Breasts: normal appearance, no masses or tenderness Heart: regular rate and rhythm Abdomen: soft, non-tender; bowel sounds normal; no masses,  no organomegaly Extremities: extremities normal, atraumatic, no cyanosis or edema Skin: Skin color, texture, turgor normal. No rashes or lesions Lymph nodes: Cervical, supraclavicular, and axillary nodes normal. No abnormal inguinal  nodes palpated Neurologic: Grossly normal   Pelvic: External genitalia:  no lesions              Urethra:  normal appearing urethra with no masses, tenderness or lesions              Bartholins and Skenes: normal                 Vagina: normal appearing vagina with normal color and discharge, no lesions              Cervix: no lesions              Pap taken: No. Bimanual Exam:  Uterus:  normal size, contour, position, consistency, mobility, non-tender              Adnexa: normal adnexa and no mass, fullness, tenderness               Rectovaginal: Confirms               Anus:  normal sphincter tone, no lesions  Chaperone was present for  exam.  A:  Well Woman with normal exam PMP, on low dose HRT H/O pre-melanoma.  Sees dermatologist yearly Low Vit D Osteopenia  P: Mammogram yearly. D/W pt 3D MMG. pap smear with neg HR HPV 9/13.  Neg 11/15.  No pap today. On Prempro 0.3/1.5.  Pt is going to stop cutting in half and take every other day.  D/W pt how to taper down and off, if possible.  D/W pt again this year risks including DVT/PE/stroke/MI/breast cancer. Lipids, CMP, Vit D On Vit D 50K every 14 days. RX to pharmacy.  tdap updated today return annually or prn

## 2015-06-12 LAB — VITAMIN D 25 HYDROXY (VIT D DEFICIENCY, FRACTURES): Vit D, 25-Hydroxy: 25 ng/mL — ABNORMAL LOW (ref 30–100)

## 2015-06-14 ENCOUNTER — Other Ambulatory Visit: Payer: Self-pay | Admitting: Obstetrics & Gynecology

## 2015-06-14 MED ORDER — VITAMIN D (ERGOCALCIFEROL) 1.25 MG (50000 UNIT) PO CAPS: 50000.0000 [IU] | ORAL_CAPSULE | ORAL | Status: DC

## 2015-12-05 ENCOUNTER — Telehealth: Payer: Self-pay | Admitting: Obstetrics & Gynecology

## 2015-12-05 NOTE — Telephone Encounter (Signed)
PA for Prempro send via cover my meds.

## 2015-12-05 NOTE — Telephone Encounter (Signed)
Patient called and said, "My pharmacy, [facility], told me my insurance company needs a letter of necessity for my Prempro."

## 2015-12-13 NOTE — Telephone Encounter (Signed)
Spoke with patient. Advised PA for Prempro has been approved from 09/06/2015-12/04/2016. She is agreeable and verbalizes understanding. Approval to Dr.Miller for signature for scan.  Routing to provider for final review. Patient agreeable to disposition. Will close encounter.

## 2016-03-05 DIAGNOSIS — Z23 Encounter for immunization: Secondary | ICD-10-CM | POA: Diagnosis not present

## 2016-03-24 ENCOUNTER — Other Ambulatory Visit: Payer: Self-pay | Admitting: Obstetrics & Gynecology

## 2016-03-24 DIAGNOSIS — Z1231 Encounter for screening mammogram for malignant neoplasm of breast: Secondary | ICD-10-CM

## 2016-04-02 DIAGNOSIS — L989 Disorder of the skin and subcutaneous tissue, unspecified: Secondary | ICD-10-CM | POA: Diagnosis not present

## 2016-04-02 DIAGNOSIS — D2261 Melanocytic nevi of right upper limb, including shoulder: Secondary | ICD-10-CM | POA: Diagnosis not present

## 2016-04-02 DIAGNOSIS — L309 Dermatitis, unspecified: Secondary | ICD-10-CM | POA: Diagnosis not present

## 2016-04-02 DIAGNOSIS — D485 Neoplasm of uncertain behavior of skin: Secondary | ICD-10-CM | POA: Diagnosis not present

## 2016-04-02 DIAGNOSIS — L570 Actinic keratosis: Secondary | ICD-10-CM | POA: Diagnosis not present

## 2016-04-23 ENCOUNTER — Ambulatory Visit: Payer: Medicare Other

## 2016-04-30 ENCOUNTER — Ambulatory Visit: Payer: Medicare Other

## 2016-05-14 ENCOUNTER — Ambulatory Visit
Admission: RE | Admit: 2016-05-14 | Discharge: 2016-05-14 | Disposition: A | Discharge status: Home / Self Care | Payer: Medicare Other | Source: Ambulatory Visit | Attending: Obstetrics & Gynecology | Admitting: Obstetrics & Gynecology

## 2016-05-14 DIAGNOSIS — Z1231 Encounter for screening mammogram for malignant neoplasm of breast: Secondary | ICD-10-CM

## 2016-05-16 ENCOUNTER — Other Ambulatory Visit: Payer: Self-pay | Admitting: Obstetrics & Gynecology

## 2016-05-16 DIAGNOSIS — R928 Other abnormal and inconclusive findings on diagnostic imaging of breast: Secondary | ICD-10-CM

## 2016-05-28 ENCOUNTER — Ambulatory Visit
Admission: RE | Admit: 2016-05-28 | Discharge: 2016-05-28 | Disposition: A | Discharge status: Home / Self Care | Payer: Medicare Other | Source: Ambulatory Visit | Attending: Obstetrics & Gynecology | Admitting: Obstetrics & Gynecology

## 2016-05-28 DIAGNOSIS — R928 Other abnormal and inconclusive findings on diagnostic imaging of breast: Secondary | ICD-10-CM

## 2016-05-28 DIAGNOSIS — R922 Inconclusive mammogram: Secondary | ICD-10-CM | POA: Diagnosis not present

## 2016-05-28 DIAGNOSIS — N6489 Other specified disorders of breast: Secondary | ICD-10-CM | POA: Diagnosis not present

## 2016-06-16 NOTE — Progress Notes (Signed)
66 y.o. G2P1 MarriedCaucasianF here for annual exam.  Doing well.  No vaginal bleeding.    PCP:  Dr. Quintin Alto  Patient's last menstrual period was 05/19/1990.          Sexually active: No.  The current method of family planning is tubal ligation.    Exercising: Yes.    The patient has a physically strenuous job, but has no regular exercise apart from work.  Smoker:  no  Health Maintenance: Pap:  03/30/14 negative History of abnormal Pap:  no MMG:  05/28/16 BIRADS 1 negative  Colonoscopy:  11/10/12 normal- repeat 10 years BMD:   03/30/13 osteopenia TDaP:  06/11/15  Pneumonia vaccine(s):  never Zostavax:   2-3 years ago at [place] C testing: discuss with provider  Screening Labs: discuss with provider-draw at end of appointment, Hb today: same, Urine today: declined   reports that she has never smoked. She has never used smokeless tobacco. She reports that she does not drink alcohol or use drugs.  Past Medical History:  Diagnosis Date  . Atypical moles   . GERD (gastroesophageal reflux disease)   . Melanoma ([place]) 1998  . Mixed incontinence   . Osteopenia     Past Surgical History:  Procedure Laterality Date  . APPENDECTOMY  1965  . BREAST BIOPSY     fibrocystic changes  . CESAREAN SECTION    . excision of melanoma  1998   Dr Jon Gills  . TONSILLECTOMY  1955  . TUBAL LIGATION      Current Outpatient Prescriptions  Medication Sig Dispense Refill  . b complex vitamins tablet Take 1 tablet by mouth daily.    Marland Kitchen estrogen, conjugated,-medroxyprogesterone (PREMPRO) 0.3-1.5 MG tablet Take 1 tablet by mouth daily. 30 tablet 13  . Loratadine (CLARITIN PO) Take 1 capsule by mouth.    . Vitamin D, Ergocalciferol, (DRISDOL) 50000 units CAPS capsule Take 1 capsule (50,000 Units total) by mouth every 7 (seven) days. 12 capsule 4   No current facility-administered medications for this visit.     Family History  Problem Relation Age of Onset  . Heart disease Maternal Uncle   .  Hypertension Mother   . Kidney disease Mother   . Lung cancer Father   . Lung cancer Brother   . Melanoma Brother   . Transient ischemic attack Mother     ROS:  Pertinent items are noted in HPI.  Otherwise, a comprehensive ROS was negative.  Exam:   BP 136/90 (BP Location: Right Arm, Patient Position: Sitting, Cuff Size: Normal)   Pulse 72   Resp 14   Ht 5\' 3"  (1.6 m)   Wt 128 lb (58.1 kg)   LMP 05/19/1990   BMI 22.67 kg/m   Height: 5\' 3"  (160 cm)  Ht Readings from Last 3 Encounters:  06/17/16 5\' 3"  (1.6 m)  06/11/15 5' 3.25" (1.607 m)  03/30/14 5' 3.5" (1.613 m)    General appearance: alert, cooperative and appears stated age Head: Normocephalic, without obvious abnormality, atraumatic Neck: no adenopathy, supple, symmetrical, trachea midline and thyroid normal to inspection and palpation Lungs: clear to auscultation bilaterally Breasts: normal appearance, no masses or tenderness Heart: regular rate and rhythm Abdomen: soft, non-tender; bowel sounds normal; no masses,  no organomegaly Extremities: extremities normal, atraumatic, no cyanosis or edema Skin: Skin color, texture, turgor normal. No rashes or lesions Lymph nodes: Cervical, supraclavicular, and axillary nodes normal. No abnormal inguinal nodes palpated Neurologic: Grossly normal  Pelvic: External genitalia:  no lesions  Urethra:  normal appearing urethra with no masses, tenderness or lesions              Bartholins and Skenes: normal                 Vagina: normal appearing vagina with normal color and discharge, no lesions              Cervix: no lesions              Pap taken: Yes.   Bimanual Exam:  Uterus:  normal size, contour, position, consistency, mobility, non-tender              Adnexa: normal adnexa and no mass, fullness, tenderness               Rectovaginal: Confirms               Anus:  normal sphincter tone, no lesions  Chaperone was present for exam.  A:    Well Woman with  normal exam PMP, on low dose HRT H/O pre-melanoma.  Sees dermatologist yearly. Osteopenia in spine  P: Mammogram yearly. D/W pt 3D MMG. pap smear with neg HR HPV 9/13.  Pap obtained today. On Prempro 0.3/1.5. Rx to pharmacy.  Pt has weaned down to every other day.  Pt will continue to try and decrease.  She is clearly aware of risks and desires to continue.   Lipids, CMP, CBC, TSH Hep C antibody On Vit D 50K every 14 days. RX to pharmacy.  Declines having pneumonia vaccine this year return annually or prn

## 2016-06-17 ENCOUNTER — Ambulatory Visit (INDEPENDENT_AMBULATORY_CARE_PROVIDER_SITE_OTHER): Payer: Medicare Other | Admitting: Obstetrics & Gynecology

## 2016-06-17 ENCOUNTER — Encounter: Payer: Self-pay | Admitting: Obstetrics & Gynecology

## 2016-06-17 VITALS — BP 136/90 | HR 72 | Resp 14 | Ht 63.0 in | Wt 128.0 lb

## 2016-06-17 DIAGNOSIS — Z124 Encounter for screening for malignant neoplasm of cervix: Secondary | ICD-10-CM | POA: Diagnosis not present

## 2016-06-17 DIAGNOSIS — E785 Hyperlipidemia, unspecified: Secondary | ICD-10-CM

## 2016-06-17 DIAGNOSIS — Z205 Contact with and (suspected) exposure to viral hepatitis: Secondary | ICD-10-CM | POA: Diagnosis not present

## 2016-06-17 DIAGNOSIS — Z01419 Encounter for gynecological examination (general) (routine) without abnormal findings: Secondary | ICD-10-CM | POA: Diagnosis not present

## 2016-06-17 DIAGNOSIS — M8588 Other specified disorders of bone density and structure, other site: Secondary | ICD-10-CM

## 2016-06-17 DIAGNOSIS — E2839 Other primary ovarian failure: Secondary | ICD-10-CM | POA: Diagnosis not present

## 2016-06-17 LAB — COMPREHENSIVE METABOLIC PANEL
ALT: 11 U/L (ref 6–29)
AST: 12 U/L (ref 10–35)
Albumin: 4.3 g/dL (ref 3.6–5.1)
Alkaline Phosphatase: 51 U/L (ref 33–130)
BUN: 12 mg/dL (ref 7–25)
CO2: 25 mmol/L (ref 20–31)
Calcium: 9.5 mg/dL (ref 8.6–10.4)
Chloride: 106 mmol/L (ref 98–110)
Creat: 0.65 mg/dL (ref 0.50–0.99)
Glucose, Bld: 96 mg/dL (ref 65–99)
Potassium: 4.1 mmol/L (ref 3.5–5.3)
Sodium: 140 mmol/L (ref 135–146)
Total Bilirubin: 0.5 mg/dL (ref 0.2–1.2)
Total Protein: 7.4 g/dL (ref 6.1–8.1)

## 2016-06-17 LAB — CBC
HCT: 44.4 % (ref 35.0–45.0)
Hemoglobin: 14.9 g/dL (ref 11.7–15.5)
MCH: 30.4 pg (ref 27.0–33.0)
MCHC: 33.6 g/dL (ref 32.0–36.0)
MCV: 90.6 fL (ref 80.0–100.0)
MPV: 9.9 fL (ref 7.5–12.5)
Platelets: 259 10*3/uL (ref 140–400)
RBC: 4.9 MIL/uL (ref 3.80–5.10)
RDW: 12.8 % (ref 11.0–15.0)
WBC: 6.2 10*3/uL (ref 3.8–10.8)

## 2016-06-17 LAB — LIPID PANEL
Cholesterol: 209 mg/dL — ABNORMAL HIGH (ref <200)
HDL: 59 mg/dL (ref >50)
LDL Cholesterol: 121 mg/dL — ABNORMAL HIGH (ref <100)
Total CHOL/HDL Ratio: 3.5 Ratio (ref <5.0)
Triglycerides: 145 mg/dL (ref <150)
VLDL: 29 mg/dL (ref <30)

## 2016-06-17 LAB — TSH: TSH: 1.29 mIU/L

## 2016-06-17 MED ORDER — VITAMIN D (ERGOCALCIFEROL) 1.25 MG (50000 UNIT) PO CAPS: 50000.0000 [IU] | ORAL_CAPSULE | ORAL | 4 refills | Status: DC

## 2016-06-17 MED ORDER — CONJ ESTROG-MEDROXYPROGEST ACE 0.3-1.5 MG PO TABS
1.0000 | ORAL_TABLET | Freq: Every day | ORAL | 13 refills | Status: DC
Start: 2016-06-17 — End: 2017-07-11

## 2016-06-18 LAB — HEPATITIS C ANTIBODY: HCV Ab: NEGATIVE

## 2016-06-19 LAB — IPS PAP SMEAR ONLY

## 2016-11-12 DIAGNOSIS — D485 Neoplasm of uncertain behavior of skin: Secondary | ICD-10-CM | POA: Diagnosis not present

## 2016-11-12 DIAGNOSIS — D230 Other benign neoplasm of skin of lip: Secondary | ICD-10-CM | POA: Diagnosis not present

## 2016-11-14 DIAGNOSIS — H524 Presbyopia: Secondary | ICD-10-CM | POA: Diagnosis not present

## 2016-11-14 DIAGNOSIS — H5203 Hypermetropia, bilateral: Secondary | ICD-10-CM | POA: Diagnosis not present

## 2016-11-14 DIAGNOSIS — H43811 Vitreous degeneration, right eye: Secondary | ICD-10-CM | POA: Diagnosis not present

## 2016-11-14 DIAGNOSIS — H43311 Vitreous membranes and strands, right eye: Secondary | ICD-10-CM | POA: Diagnosis not present

## 2016-11-14 DIAGNOSIS — H52223 Regular astigmatism, bilateral: Secondary | ICD-10-CM | POA: Diagnosis not present

## 2016-11-14 DIAGNOSIS — H43812 Vitreous degeneration, left eye: Secondary | ICD-10-CM | POA: Diagnosis not present

## 2016-11-14 DIAGNOSIS — H16229 Keratoconjunctivitis sicca, not specified as Sjogren's, unspecified eye: Secondary | ICD-10-CM | POA: Diagnosis not present

## 2016-11-14 DIAGNOSIS — H04129 Dry eye syndrome of unspecified lacrimal gland: Secondary | ICD-10-CM | POA: Diagnosis not present

## 2016-12-31 ENCOUNTER — Telehealth: Payer: Self-pay | Admitting: Obstetrics & Gynecology

## 2016-12-31 NOTE — Telephone Encounter (Signed)
[facility] calling to get prior authorization for patient's prempro prescription. Patient leaving for vacation on the 21st and trying to get this taken care of. Pharmacy number [phone].

## 2016-12-31 NOTE — Telephone Encounter (Signed)
PA submitted via Cover My Meds. Response will take 1-3 days. Patient has been notified.

## 2016-12-31 NOTE — Telephone Encounter (Signed)
PA for prempro approved from 10/02/2016 - 12/31/2017. Call to [facility]. Notified of approval. Left detailed message at number provided for patient [phone], okay per ROI. Advised of approval and that she may fill and pick up rx at this time.  Routing to provider for final review. Patient agreeable to disposition. Will close encounter.

## 2017-03-04 DIAGNOSIS — Z23 Encounter for immunization: Secondary | ICD-10-CM | POA: Diagnosis not present

## 2017-04-14 DIAGNOSIS — D23111 Other benign neoplasm of skin of right upper eyelid, including canthus: Secondary | ICD-10-CM | POA: Diagnosis not present

## 2017-04-14 DIAGNOSIS — D23122 Other benign neoplasm of skin of left lower eyelid, including canthus: Secondary | ICD-10-CM | POA: Diagnosis not present

## 2017-06-02 ENCOUNTER — Other Ambulatory Visit: Payer: Self-pay | Admitting: Obstetrics & Gynecology

## 2017-06-02 DIAGNOSIS — Z1231 Encounter for screening mammogram for malignant neoplasm of breast: Secondary | ICD-10-CM

## 2017-07-01 ENCOUNTER — Ambulatory Visit
Admission: RE | Admit: 2017-07-01 | Discharge: 2017-07-01 | Disposition: A | Discharge status: Home / Self Care | Payer: Medicare Other | Source: Ambulatory Visit | Attending: Obstetrics & Gynecology | Admitting: Obstetrics & Gynecology

## 2017-07-01 DIAGNOSIS — Z1231 Encounter for screening mammogram for malignant neoplasm of breast: Secondary | ICD-10-CM | POA: Diagnosis not present

## 2017-07-11 ENCOUNTER — Other Ambulatory Visit: Payer: Self-pay | Admitting: Obstetrics & Gynecology

## 2017-07-13 NOTE — Telephone Encounter (Signed)
Medication refill request: Vit D #12 and Prempro 0.3-1.5 #30 Last AEX:  06-17-16 Next AEX: 09-17-17 Last MMG (if hormonal medication request): 07-01-17 [id number] Refill authorized: Please advise on both medications

## 2017-09-17 ENCOUNTER — Encounter

## 2017-09-17 ENCOUNTER — Ambulatory Visit (INDEPENDENT_AMBULATORY_CARE_PROVIDER_SITE_OTHER): Payer: Medicare Other | Admitting: Obstetrics & Gynecology

## 2017-09-17 ENCOUNTER — Other Ambulatory Visit: Payer: Self-pay

## 2017-09-17 ENCOUNTER — Encounter: Payer: Self-pay | Admitting: Obstetrics & Gynecology

## 2017-09-17 VITALS — BP 142/82 | HR 72 | Resp 18 | Ht 63.25 in | Wt 131.0 lb

## 2017-09-17 DIAGNOSIS — M8588 Other specified disorders of bone density and structure, other site: Secondary | ICD-10-CM

## 2017-09-17 DIAGNOSIS — Z01419 Encounter for gynecological examination (general) (routine) without abnormal findings: Secondary | ICD-10-CM

## 2017-09-17 DIAGNOSIS — Z124 Encounter for screening for malignant neoplasm of cervix: Secondary | ICD-10-CM | POA: Diagnosis not present

## 2017-09-17 MED ORDER — VITAMIN D (ERGOCALCIFEROL) 1.25 MG (50000 UNIT) PO CAPS: 50000.0000 [IU] | ORAL_CAPSULE | ORAL | 4 refills | Status: DC

## 2017-09-17 MED ORDER — CONJ ESTROG-MEDROXYPROGEST ACE 0.3-1.5 MG PO TABS: 1.0000 | ORAL_TABLET | Freq: Every day | ORAL | 12 refills | Status: DC

## 2017-09-17 NOTE — Progress Notes (Signed)
67 y.o. G2P1 MarriedCaucasianF here for annual exam.  Doing well.    PCP:  Dr. Quintin Alto.  Hasn't seen recently.  Patient's last menstrual period was 05/19/1990.          Sexually active: No.  The current method of family planning is tubal ligation.    Exercising: Yes.    at work Smoker:  no  Health Maintenance: Pap:  06/17/16 Neg   03/30/14 Neg.  History of abnormal Pap:  no MMG:  07/01/17 BIRADS1:Neg.  Did 3D.  Has grade D breast density. Colonoscopy:  11/10/12 Normal. F/u 10 years  BMD:   03/30/13 Osteopenia  TDaP:  2017 Pneumonia vaccine(s):  2018 Shingrix:  Zostavax done Hep C testing: 06/17/16 Neg  Screening Labs: if needed    reports that she has never smoked. She has never used smokeless tobacco. She reports that she does not drink alcohol or use drugs.  Past Medical History:  Diagnosis Date  . Atypical moles   . GERD (gastroesophageal reflux disease)   . Melanoma ([place]) 1998  . Mixed incontinence   . Osteopenia     Past Surgical History:  Procedure Laterality Date  . APPENDECTOMY  1965  . BREAST BIOPSY Left 2011   fibrocystic changes  . CESAREAN SECTION    . excision of melanoma  1998   Dr Jon Gills  . TONSILLECTOMY  1955  . TUBAL LIGATION      Current Outpatient Medications  Medication Sig Dispense Refill  . b complex vitamins tablet Take 1 tablet by mouth daily.    . Loratadine (CLARITIN PO) Take 1 capsule by mouth.    Marland Kitchen PREMPRO 0.3-1.5 MG tablet TAKE 1 TABLET BY MOUTH DAILY. 30 tablet 3  . Vitamin D, Ergocalciferol, (DRISDOL) 50000 units CAPS capsule TAKE 1 CAPSULE BY MOUTH EVERY 7 DAYS. 12 capsule 1   No current facility-administered medications for this visit.     Family History  Problem Relation Age of Onset  . Hypertension Mother   . Kidney disease Mother   . Transient ischemic attack Mother   . Lung cancer Father   . Lung cancer Brother   . Heart disease Maternal Uncle   . Melanoma Brother     Review of Systems  All other systems reviewed and  are negative.   Exam:   BP (!) 142/82 (BP Location: Right Arm, Patient Position: Sitting, Cuff Size: Normal)   Pulse 72   Resp 18   Ht 5' 3.25" (1.607 m)   Wt 131 lb (59.4 kg)   LMP 05/19/1990   BMI 23.02 kg/m    Height: 5' 3.25" (160.7 cm)  Ht Readings from Last 3 Encounters:  09/17/17 5' 3.25" (1.607 m)  06/17/16 5\' 3"  (1.6 m)  06/11/15 5' 3.25" (1.607 m)    General appearance: alert, cooperative and appears stated age Head: Normocephalic, without obvious abnormality, atraumatic Neck: no adenopathy, supple, symmetrical, trachea midline and thyroid normal to inspection and palpation Lungs: clear to auscultation bilaterally Breasts: normal appearance, no masses or tenderness Heart: regular rate and rhythm Abdomen: soft, non-tender; bowel sounds normal; no masses,  no organomegaly Extremities: extremities normal, atraumatic, no cyanosis or edema Skin: Skin color, texture, turgor normal. No rashes or lesions Lymph nodes: Cervical, supraclavicular, and axillary nodes normal. No abnormal inguinal nodes palpated Neurologic: Grossly normal   Pelvic: External genitalia:  no lesions              Urethra:  normal appearing urethra with no masses, tenderness or lesions  Bartholins and Skenes: normal                 Vagina: normal appearing vagina with normal color and discharge, no lesions              Cervix: no lesions              Pap taken: No. Bimanual Exam:  Uterus:  normal size, contour, position, consistency, mobility, non-tender              Adnexa: normal adnexa and no mass, fullness, tenderness               Rectovaginal: Confirms               Anus:  normal sphincter tone, no lesions  Chaperone was present for exam.  A:  Well Woman with normal exam PMP, on low dosed HRT H/o pre-melanoma.  Seeing dermatology yearly. Osteopenia in the spine Vit D deficiency  P:   Mammogram guidelines reviewed.  Doing 3D due to breast density pap smear neg 2018.  Not  indicated today RF for Prem-pro 0.3/1.5mg  daily.  #30/12.  Trying to take every other day Vit D 50K weekly.  #12/4RF Will do blood work, esp Vit D next year Shingrix vaccination reviewed Will finish pneumonia vaccination this year Order for BMD placed for next year Return annually or prn

## 2017-09-17 NOTE — Patient Instructions (Addendum)
Please try and do your bone density with your next mammogram.  Shingrix--the shingles vaccine.

## 2017-10-16 DIAGNOSIS — H524 Presbyopia: Secondary | ICD-10-CM | POA: Diagnosis not present

## 2017-10-16 DIAGNOSIS — D485 Neoplasm of uncertain behavior of skin: Secondary | ICD-10-CM | POA: Diagnosis not present

## 2017-10-16 DIAGNOSIS — H2513 Age-related nuclear cataract, bilateral: Secondary | ICD-10-CM | POA: Diagnosis not present

## 2017-11-10 DIAGNOSIS — R002 Palpitations: Secondary | ICD-10-CM | POA: Diagnosis not present

## 2017-11-10 DIAGNOSIS — Z6822 Body mass index (BMI) 22.0-22.9, adult: Secondary | ICD-10-CM | POA: Diagnosis not present

## 2018-02-16 DIAGNOSIS — D23121 Other benign neoplasm of skin of left upper eyelid, including canthus: Secondary | ICD-10-CM | POA: Diagnosis not present

## 2018-02-17 DIAGNOSIS — Z23 Encounter for immunization: Secondary | ICD-10-CM | POA: Diagnosis not present

## 2018-04-22 ENCOUNTER — Telehealth: Payer: Self-pay

## 2018-04-22 NOTE — Telephone Encounter (Signed)
SENT REFERRAL TO SCHEDULING AND FILED NOTES 

## 2018-06-01 ENCOUNTER — Encounter: Payer: Self-pay | Admitting: *Deleted

## 2018-06-02 ENCOUNTER — Ambulatory Visit (INDEPENDENT_AMBULATORY_CARE_PROVIDER_SITE_OTHER): Payer: Medicare Other | Admitting: Cardiology

## 2018-06-02 ENCOUNTER — Encounter: Payer: Self-pay | Admitting: Cardiology

## 2018-06-02 VITALS — BP 177/93 | HR 80 | Ht 63.0 in | Wt 130.2 lb

## 2018-06-02 DIAGNOSIS — R002 Palpitations: Secondary | ICD-10-CM

## 2018-06-02 DIAGNOSIS — R030 Elevated blood-pressure reading, without diagnosis of hypertension: Secondary | ICD-10-CM | POA: Diagnosis not present

## 2018-06-02 NOTE — Progress Notes (Signed)
Clinical Summary Jamie Duke is a 68 y.o.female seen as new consult, referred by Dr Quintin Alto for palpitations  1. Palpitations - ongoing several years. Variable in frequency. Sometimes can happen several times a day for several days in a row, sometimes can go weeks without symptoms.   Mainly occurs at night. feeling of heart racing, lasts less than 1 minute. No other associated symptoms. 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 Atypical moles   . GERD (gastroesophageal reflux disease)   . Melanoma ([place]) 1998  . Mixed incontinence   . Osteopenia      No Known Allergies   Current Outpatient Medications  Medication Sig Dispense Refill  . b complex vitamins tablet Take 1 tablet by mouth daily.    Marland Kitchen estrogen, conjugated,-medroxyprogesterone (PREMPRO) 0.3-1.5 MG tablet Take 1 tablet by mouth daily. 30 tablet 12  . Loratadine (CLARITIN PO) Take 1 capsule by mouth.    . Vitamin D, Ergocalciferol, (DRISDOL) 50000 units CAPS capsule Take 1 capsule (50,000 Units total) by mouth every 7 (seven) days. 12 capsule 4   No current facility-administered medications for this visit.      Past Surgical History:  Procedure Laterality Date  . APPENDECTOMY  1965  . BREAST BIOPSY Left 2011   fibrocystic changes  . CESAREAN SECTION    . excision of melanoma  1998   Dr Jon Gills  . TONSILLECTOMY  1955  . TUBAL LIGATION       No Known Allergies    Family History  Problem Relation Age of Onset  . Hypertension Mother   . Kidney disease Mother   . Transient ischemic attack Mother   . Lung cancer Father   . Lung cancer Brother   . Heart disease Maternal Uncle   . Melanoma Brother      Social History Ms. Guirguis reports that she has never smoked. She has never used smokeless tobacco. Ms. Gharibian reports no history of alcohol use.   Review of  Systems CONSTITUTIONAL: No weight loss, fever, chills, weakness or fatigue.  HEENT: Eyes: No visual loss, blurred vision, double vision or yellow sclerae.No hearing loss, sneezing, congestion, runny nose or sore throat.  SKIN: No rash or itching.  CARDIOVASCULAR: per hpi RESPIRATORY: No shortness of breath, cough or sputum.  GASTROINTESTINAL: No anorexia, nausea, vomiting or diarrhea. No abdominal pain or blood.  GENITOURINARY: No burning on urination, no polyuria NEUROLOGICAL: No headache, dizziness, syncope, paralysis, ataxia, numbness or tingling in the extremities. No change in bowel or bladder control.  MUSCULOSKELETAL: No muscle, back pain, joint pain or stiffness.  LYMPHATICS: No enlarged nodes. No history of splenectomy.  PSYCHIATRIC: No history of depression or anxiety.  ENDOCRINOLOGIC: No reports of sweating, cold or heat intolerance. No polyuria or polydipsia.  Marland Kitchen   Physical Examination Vitals:   06/02/18 0823 06/02/18 0829  BP: (!) 180/89 (!) 177/93  Pulse: 84 80  SpO2: 98% 98%   Vitals:   06/02/18 0823  Weight: 130 lb 3.2 oz (59.1 kg)  Height: 5\' 3"  (1.6 m)    Gen: resting comfortably, no acute distress HEENT: no scleral icterus, pupils equal round and reactive, no palptable cervical adenopathy,  CV: RRR, no m/r/g, no jvd Resp: Clear to auscultation bilaterally GI: abdomen is soft, non-tender, non-distended, normal bowel sounds, no hepatosplenomegaly MSK: extremities are warm, no edema.  Skin: warm, no rash Neuro:  no focal deficits Psych: appropriate affect     Assessment and Plan  1. Palpitations - EKG today shows normal sinus rhythm - we will plan for extended cardiac monitor for 21 days to further evaluate  2. Elevated blood pressure - manual recheck 15/90 - no diagnosis of HTN. Bp from pcp in 10/2017 was 118/78  - at this time isolated findings of elevated blood pressure, if noted on next visit would qualify for HTN and would need to likely start  meds - given info on DASH diet, educated on low sodium diet.   F/u 6 weeks  Arnoldo Lenis, M.D.,

## 2018-06-02 NOTE — Patient Instructions (Signed)
Your physician recommends that you schedule a follow-up appointment in: [place] recommends that you continue on your current medications as directed. Please refer to the Current Medication list given to you today.  Your physician has recommended that you wear an event monitor FOR 21 DAYS. Event monitors are medical devices that record the heart's electrical activity. Doctors most often [place] these monitors to diagnose arrhythmias. Arrhythmias are problems with the speed or rhythm of the heartbeat. The monitor is a small, portable device. You can wear one while you do your normal daily activities. This is usually used to diagnose what is causing palpitations/syncope (passing out).   DASH Eating Plan DASH stands for "Dietary Approaches to Stop Hypertension." The DASH eating plan is a healthy eating plan that has been shown to reduce high blood pressure (hypertension). It may also reduce your risk for type 2 diabetes, heart disease, and stroke. The DASH eating plan may also help with weight loss. What are tips for following this plan?  General guidelines  Avoid eating more than 2,300 mg (milligrams) of salt (sodium) a day. If you have hypertension, you may need to reduce your sodium intake to 1,500 mg a day.  Limit alcohol intake to no more than 1 drink a day for nonpregnant women and 2 drinks a day for men. One drink equals 12 oz of beer, 5 oz of wine, or 1 oz of hard liquor.  Work with your health care provider to maintain a healthy body weight or to lose weight. Ask what an ideal weight is for you.  Get at least 30 minutes of exercise that causes your heart to beat faster (aerobic exercise) most days of the week. Activities may include walking, swimming, or biking.  Work with your health care provider or diet and nutrition specialist (dietitian) to adjust your eating plan to your individual calorie needs. Reading food labels   Check food labels for the amount of  sodium per serving. Choose foods with less than 5 percent of the Daily Value of sodium. Generally, foods with less than 300 mg of sodium per serving fit into this eating plan.  To find whole grains, look for the word "whole" as the first word in the ingredient list. Shopping  Buy products labeled as "low-sodium" or "no salt added."  Buy fresh foods. Avoid canned foods and premade or frozen meals. Cooking  Avoid adding salt when cooking. Use salt-free seasonings or herbs instead of table salt or sea salt. Check with your health care provider or pharmacist before using salt substitutes.  Do not fry foods. Cook foods using healthy methods such as baking, boiling, grilling, and broiling instead.  Cook with heart-healthy oils, such as olive, canola, soybean, or sunflower oil. Meal planning  Eat a balanced diet that includes: ? 5 or more servings of fruits and vegetables each day. At each meal, try to fill half of your plate with fruits and vegetables. ? Up to 6-8 servings of whole grains each day. ? Less than 6 oz of lean meat, poultry, or fish each day. A 3-oz serving of meat is about the same size as a deck of cards. One egg equals 1 oz. ? 2 servings of low-fat dairy each day. ? A serving of nuts, seeds, or beans 5 times each week. ? Heart-healthy fats. Healthy fats called Omega-3 fatty acids are found in foods such as flaxseeds and coldwater fish, like sardines, salmon, and mackerel.  Limit how much you  eat of the following: ? Canned or prepackaged foods. ? Food that is high in trans fat, such as fried foods. ? Food that is high in saturated fat, such as fatty meat. ? Sweets, desserts, sugary drinks, and other foods with added sugar. ? Full-fat dairy products.  Do not salt foods before eating.  Try to eat at least 2 vegetarian meals each week.  Eat more home-cooked food and less restaurant, buffet, and fast food.  When eating at a restaurant, ask that your food be prepared with  less salt or no salt, if possible. What foods are recommended? The items listed may not be a complete list. Talk with your dietitian about what dietary choices are best for you. Grains Whole-grain or whole-wheat bread. Whole-grain or whole-wheat pasta. Brown rice. [place]. Bulgur. Whole-grain and low-sodium cereals. Pita bread. Low-fat, low-sodium crackers. Whole-wheat flour tortillas. Vegetables Fresh or frozen vegetables (raw, steamed, roasted, or grilled). Low-sodium or reduced-sodium tomato and vegetable juice. Low-sodium or reduced-sodium tomato sauce and tomato paste. Low-sodium or reduced-sodium canned vegetables. Fruits All fresh, dried, or frozen fruit. Canned fruit in natural juice (without added sugar). Meat and other protein foods Skinless chicken or [place]. Ground chicken or [place]. Pork with fat trimmed off. Fish and seafood. Egg whites. Dried beans, peas, or lentils. Unsalted nuts, nut butters, and seeds. Unsalted canned beans. Lean cuts of beef with fat trimmed off. Low-sodium, lean deli meat. Dairy Low-fat (1%) or fat-free (skim) milk. Fat-free, low-fat, or reduced-fat cheeses. Nonfat, low-sodium ricotta or cottage cheese. Low-fat or nonfat yogurt. Low-fat, low-sodium cheese. Fats and oils Soft margarine without trans fats. Vegetable oil. Low-fat, reduced-fat, or light mayonnaise and salad dressings (reduced-sodium). Canola, safflower, olive, soybean, and sunflower oils. Avocado. Seasoning and other foods Herbs. Spices. Seasoning mixes without salt. Unsalted popcorn and pretzels. Fat-free sweets. What foods are not recommended? The items listed may not be a complete list. Talk with your dietitian about what dietary choices are best for you. Grains Baked goods made with fat, such as croissants, muffins, or some breads. Dry pasta or rice meal packs. Vegetables Creamed or fried vegetables. Vegetables in a cheese sauce. Regular canned vegetables (not low-sodium or  reduced-sodium). Regular canned tomato sauce and paste (not low-sodium or reduced-sodium). Regular tomato and vegetable juice (not low-sodium or reduced-sodium). Angie Fava. Olives. Fruits Canned fruit in a light or heavy syrup. Fried fruit. Fruit in cream or butter sauce. Meat and other protein foods Fatty cuts of meat. Ribs. Fried meat. Berniece Salines. Sausage. Bologna and other processed lunch meats. Salami. Fatback. Hotdogs. Bratwurst. Salted nuts and seeds. Canned beans with added salt. Canned or smoked fish. Whole eggs or egg yolks. Chicken or [place] with skin. Dairy Whole or 2% milk, cream, and half-and-half. Whole or full-fat cream cheese. Whole-fat or sweetened yogurt. Full-fat cheese. Nondairy creamers. Whipped toppings. Processed cheese and cheese spreads. Fats and oils Butter. Stick margarine. Lard. Shortening. Ghee. Bacon fat. Tropical oils, such as coconut, palm kernel, or palm oil. Seasoning and other foods Salted popcorn and pretzels. Onion salt, garlic salt, seasoned salt, table salt, and sea salt. Worcestershire sauce. Tartar sauce. Barbecue sauce. Teriyaki sauce. Soy sauce, including reduced-sodium. Steak sauce. Canned and packaged gravies. Fish sauce. Oyster sauce. Cocktail sauce. Horseradish that you find on the shelf. Ketchup. Mustard. Meat flavorings and tenderizers. Bouillon cubes. Hot sauce and Tabasco sauce. Premade or packaged marinades. Premade or packaged taco seasonings. Relishes. Regular salad dressings. Where to find more information:  National Heart, Lung, and Blood Institute: https://wilson-eaton.com/  American  Heart Association: www.heart.org Summary  The DASH eating plan is a healthy eating plan that has been shown to reduce high blood pressure (hypertension). It may also reduce your risk for type 2 diabetes, heart disease, and stroke.  With the DASH eating plan, you should limit salt (sodium) intake to 2,300 mg a day. If you have hypertension, you may need to reduce your sodium  intake to 1,500 mg a day.  When on the DASH eating plan, aim to eat more fresh fruits and vegetables, whole grains, lean proteins, low-fat dairy, and heart-healthy fats.  Work with your health care provider or diet and nutrition specialist (dietitian) to adjust your eating plan to your individual calorie needs. This information is not intended to replace advice given to you by your health care provider. Make sure you discuss any questions you have with your health care provider. Document Released: 04/24/2011 Document Revised: 04/28/2016 Document Reviewed: 04/28/2016 Elsevier Interactive Patient Education  2019 [employer].   Thank you for choosing [facility]!!

## 2018-06-07 DIAGNOSIS — R002 Palpitations: Secondary | ICD-10-CM | POA: Diagnosis not present

## 2018-06-30 DIAGNOSIS — Z6822 Body mass index (BMI) 22.0-22.9, adult: Secondary | ICD-10-CM | POA: Diagnosis not present

## 2018-06-30 DIAGNOSIS — I10 Essential (primary) hypertension: Secondary | ICD-10-CM | POA: Diagnosis not present

## 2018-06-30 DIAGNOSIS — R002 Palpitations: Secondary | ICD-10-CM | POA: Diagnosis not present

## 2018-07-01 ENCOUNTER — Telehealth: Payer: Self-pay | Admitting: *Deleted

## 2018-07-01 NOTE — Telephone Encounter (Signed)
-----   Message from Arnoldo Lenis, MD sent at 07/01/2018  2:48 PM EST ----- Heart monitior looks good, no significant abnormal heart rhythms. If ongoing symptoms we could discuss trying medications at our next visit, but monitor overall very reassuring   J BrancH MD

## 2018-07-01 NOTE — Telephone Encounter (Signed)
Pt voiced understanding - routed to pcp  

## 2018-07-14 ENCOUNTER — Encounter: Payer: Self-pay | Admitting: Cardiology

## 2018-07-14 ENCOUNTER — Ambulatory Visit (INDEPENDENT_AMBULATORY_CARE_PROVIDER_SITE_OTHER): Payer: Medicare Other | Admitting: Cardiology

## 2018-07-14 VITALS — BP 173/91 | HR 72 | Ht 64.0 in | Wt 132.2 lb

## 2018-07-14 DIAGNOSIS — I10 Essential (primary) hypertension: Secondary | ICD-10-CM | POA: Diagnosis not present

## 2018-07-14 DIAGNOSIS — R002 Palpitations: Secondary | ICD-10-CM

## 2018-07-14 MED ORDER — AMLODIPINE BESYLATE 5 MG PO TABS: 5.0000 mg | ORAL_TABLET | Freq: Every day | ORAL | 1 refills | Status: DC

## 2018-07-14 NOTE — Progress Notes (Signed)
Clinical Summary Jamie Duke is a 68 y.o.female  1. Palpitations - ongoing several years. Variable in frequency. Sometimes can happen several times a day for several days in a row, sometimes can go weeks without symptoms.   Mainly occurs at night. feeling of heart racing, lasts less than 1 minute. No other associated symptoms. - no coffee, decaf tea, limited sodas, no energy drinks, no EtOH   Jan 2020 monitor without arrhythmias - pcp started toprol 25mg  2 weeks ago. Symptoms improved on beta blocker.    2. Elevated blood pressure - noted last visit, does not carry diagnosis of HTN as of yet - home bp's remain elevated since last visit even on Toprol. 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 Atypical moles   . GERD (gastroesophageal reflux disease)   . Melanoma ([place]) 1998  . Mixed incontinence   . Osteopenia      No Known Allergies   Current Outpatient Medications  Medication Sig Dispense Refill  . b complex vitamins tablet Take 1 tablet by mouth daily.    Marland Kitchen estrogen, conjugated,-medroxyprogesterone (PREMPRO) 0.3-1.5 MG tablet Take 1 tablet by mouth daily. (Patient taking differently: Take 0.5 tablets by mouth daily. ) 30 tablet 12  . Loratadine (CLARITIN PO) Take 1 capsule by mouth.    . Vitamin D, Ergocalciferol, (DRISDOL) 50000 units CAPS capsule Take 1 capsule (50,000 Units total) by mouth every 7 (seven) days. 12 capsule 4   No current facility-administered medications for this visit.      Past Surgical History:  Procedure Laterality Date  . APPENDECTOMY  1965  . BREAST BIOPSY Left 2011   fibrocystic changes  . CESAREAN SECTION    . excision of melanoma  1998   Dr Jon Gills  . TONSILLECTOMY  1955  . TUBAL LIGATION       No Known Allergies    Family History  Problem Relation Age of Onset  . Hypertension Mother   . Kidney  disease Mother   . Transient ischemic attack Mother   . Lung cancer Father   . Lung cancer Brother   . Heart disease Maternal Uncle   . Melanoma Brother      Social History Ms. Lamb reports that she has never smoked. She has never used smokeless tobacco. Ms. Brys reports no history of alcohol use.   Review of Systems CONSTITUTIONAL: No weight loss, fever, chills, weakness or fatigue.  HEENT: Eyes: No visual loss, blurred vision, double vision or yellow sclerae.No hearing loss, sneezing, congestion, runny nose or sore throat.  SKIN: No rash or itching.  CARDIOVASCULAR: per hpi RESPIRATORY: No shortness of breath, cough or sputum.  GASTROINTESTINAL: No anorexia, nausea, vomiting or diarrhea. No abdominal pain or blood.  GENITOURINARY: No burning on urination, no polyuria NEUROLOGICAL: No headache, dizziness, syncope, paralysis, ataxia, numbness or tingling in the extremities. No change in bowel or bladder control.  MUSCULOSKELETAL: No muscle, back pain, joint pain or stiffness.  LYMPHATICS: No enlarged nodes. No history of splenectomy.  PSYCHIATRIC: No history of depression or anxiety.  ENDOCRINOLOGIC: No reports of sweating, cold or heat intolerance. No polyuria or polydipsia.  Marland Kitchen   Physical Examination Vitals:   07/14/18 0838  BP: (!) 173/91  Pulse: 72  SpO2: 98%   Vitals:   07/14/18 0838  Weight: 132 lb 3.2 oz (60 kg)  Height: 5\' 4"  (  1.626 m)    Gen: resting comfortably, no acute distress HEENT: no scleral icterus, pupils equal round and reactive, no palptable cervical adenopathy,  CV: RRR, no mr/g, no jvd Resp: Clear to auscultation bilaterally GI: abdomen is soft, non-tender, non-distended, normal bowel sounds, no hepatosplenomegaly MSK: extremities are warm, no edema.  Skin: warm, no rash Neuro:  no focal deficits Psych: appropriate affect   Diagnostic Studies  Jan 2020 monitor  21 day event monitor  Min HR 61, Max HR 129, Avg HR 79  No symptoms  reported  Telemetry shows normal sinus rhythm, no significant arrhythmias   Assessment and Plan   1. Palpitations - benign event monitor, started on Toprol by pcp with resolution of symptoms - continue to monitor.   2. HTN - bp elevated today, home bp's remain elevated. - would consider Toprol more for her palpitations and I don't think will be able to control her bp as sole agent, she is going to require an additional bp agent. Will start norvac 5mg  daily, in 2 weeks submit bp log.    F/u 6 months     Arnoldo Lenis, M.D.

## 2018-07-14 NOTE — Patient Instructions (Signed)
Your physician wants you to follow-up in: [place] will receive a reminder letter in the mail two months in advance. If you don't receive a letter, please call our office to schedule the follow-up appointment.  Your physician has recommended you make the following change in your medication:   START AMLODIPINE 5 MG DAILY   Your physician has requested that you regularly monitor and record your blood pressure readings at home FOR 2 [place]. Please use the same machine at the same time of day to check your readings  Thank you for choosing [facility]!!

## 2018-07-16 ENCOUNTER — Telehealth: Payer: Self-pay | Admitting: Obstetrics & Gynecology

## 2018-07-16 ENCOUNTER — Other Ambulatory Visit: Payer: Self-pay | Admitting: Obstetrics & Gynecology

## 2018-07-16 NOTE — Telephone Encounter (Signed)
Patient called to report left arm pain near her arm pit that has been happening on and off for awhile. She said she mentioned it when scheduling her mammogram and they told her to call our office for advice.  Last seen:  09/17/17

## 2018-07-16 NOTE — Telephone Encounter (Signed)
Spoke with patient. Patient reports intermittent left breast and axilla pain for several years, has gotten worse. Increased soreness. Denies any recent injuries, lumps, nipple d/c, skin changes, fever/chills. Last MMG 07/01/17, unable to schedule, was advised to f/u with GYN.   OV scheduled for 3/2 at [facility] with Dr. Sabra Heck.   Routing to provider for final review. Patient is agreeable to disposition. Will close encounter.

## 2018-07-19 ENCOUNTER — Encounter: Payer: Self-pay | Admitting: Obstetrics & Gynecology

## 2018-07-19 ENCOUNTER — Other Ambulatory Visit: Payer: Self-pay

## 2018-07-19 ENCOUNTER — Ambulatory Visit (INDEPENDENT_AMBULATORY_CARE_PROVIDER_SITE_OTHER): Payer: Medicare Other | Admitting: Obstetrics & Gynecology

## 2018-07-19 VITALS — BP 140/78 | HR 72 | Resp 16 | Ht 64.0 in | Wt 131.2 lb

## 2018-07-19 DIAGNOSIS — N644 Mastodynia: Secondary | ICD-10-CM | POA: Diagnosis not present

## 2018-07-19 NOTE — Progress Notes (Signed)
Patient is scheduled for Bilateral Breast Diagnostic Mammogram and L Breast Ultrasound at [facility] imaging on 07/21/2018 at 0810 . Patient agreeable to time/date/location.

## 2018-07-19 NOTE — Progress Notes (Signed)
GYNECOLOGY  VISIT  CC:   Left breast pain  HPI: 68 y.o. G2P1 Married White or Caucasian female here for complaint of left breast pain that has been present off and on for several months.  She does intermittently have bilateral nipple tenderness.  Denies trauma.  No nipple discharge.  Last MMG 07/01/17 and she has Grade D breast density.    Reports today she has no pain but when it does occur, the pain in the the upper outer and lateral portion of the left breast up into her axilla.  She called to make her MMG appt and notified the [facility] of her breast pain and appt would not be scheduled without visit with provider.  She is here today for this.  GYNECOLOGIC HISTORY: Patient's last menstrual period was 05/19/1990. Contraception: BLT Menopausal hormone therapy: none  Patient Active Problem List   Diagnosis Date Noted  . Cough 08/23/2012  . GERD (gastroesophageal reflux disease) 08/23/2012  . Routine general medical examination at a health care facility 08/23/2012    Past Medical History:  Diagnosis Date  . Atypical moles   . GERD (gastroesophageal reflux disease)   . Melanoma ([place]) 1998  . Mixed incontinence   . Osteopenia     Past Surgical History:  Procedure Laterality Date  . APPENDECTOMY  1965  . BREAST BIOPSY Left 2011   fibrocystic changes  . CESAREAN SECTION    . excision of melanoma  1998   Dr Jon Gills  . TONSILLECTOMY  1955  . TUBAL LIGATION      MEDS:   Current Outpatient Medications on File Prior to Visit  Medication Sig Dispense Refill  . amLODipine (NORVASC) 5 MG tablet Take 1 tablet (5 mg total) by mouth daily. 90 tablet 1  . b complex vitamins tablet Take 1 tablet by mouth daily.    Marland Kitchen estrogen, conjugated,-medroxyprogesterone (PREMPRO) 0.3-1.5 MG tablet Take 1 tablet by mouth daily. (Patient taking differently: Take 0.5 tablets by mouth daily. ) 30 tablet 12  . Loratadine (CLARITIN PO) Take 1 capsule by mouth.    . metoprolol succinate (TOPROL-XL)  25 MG 24 hr tablet Take 25 mg by mouth daily.    . Vitamin D, Ergocalciferol, (DRISDOL) 50000 units CAPS capsule Take 1 capsule (50,000 Units total) by mouth every 7 (seven) days. 12 capsule 4   No current facility-administered medications on file prior to visit.     ALLERGIES: Patient has no known allergies.  Family History  Problem Relation Age of Onset  . Hypertension Mother   . Kidney disease Mother   . Transient ischemic attack Mother   . Lung cancer Father   . Lung cancer Brother   . Heart disease Maternal Uncle   . Melanoma Brother     SH:  Married, non smoker  Review of Systems  Endocrine:       Left breast pain  All other systems reviewed and are negative.   PHYSICAL EXAMINATION:    BP 140/78 (BP Location: Right Arm, Patient Position: Sitting, Cuff Size: Normal)   Pulse 72   Resp 16   Ht 5\' 4"  (1.626 m)   Wt 131 lb 3.2 oz (59.5 kg)   LMP 05/19/1990   BMI 22.52 kg/m     Physical Exam  Constitutional: She appears well-developed and well-nourished.  Neck: Normal range of motion. Neck supple. Thyromegaly present.  Cardiovascular: Normal rate and regular rhythm.  Respiratory: Effort normal and breath sounds normal. Right breast exhibits no inverted nipple, no  mass, no nipple discharge, no skin change and no tenderness. Left breast exhibits no inverted nipple, no mass, no nipple discharge, no skin change and no tenderness. Breasts are symmetrical.  No nipple discharge/blood on exam.  Lymphadenopathy:    She has no cervical adenopathy.    Assessment: Left breast pain Grade D breast density  Plan: Plan diagnostic MMG with left axillary ultrasound.  Pt aware she will have results the day of the imaging.

## 2018-07-21 ENCOUNTER — Ambulatory Visit
Admission: RE | Admit: 2018-07-21 | Discharge: 2018-07-21 | Disposition: A | Discharge status: Home / Self Care | Payer: Medicare Other | Source: Ambulatory Visit | Attending: Obstetrics & Gynecology | Admitting: Obstetrics & Gynecology

## 2018-07-21 DIAGNOSIS — N644 Mastodynia: Secondary | ICD-10-CM

## 2018-07-21 DIAGNOSIS — R922 Inconclusive mammogram: Secondary | ICD-10-CM | POA: Diagnosis not present

## 2018-07-21 DIAGNOSIS — N6489 Other specified disorders of breast: Secondary | ICD-10-CM | POA: Diagnosis not present

## 2018-07-29 ENCOUNTER — Telehealth: Payer: Self-pay | Admitting: *Deleted

## 2018-07-29 NOTE — Telephone Encounter (Signed)
Pt made aware

## 2018-07-29 NOTE — Telephone Encounter (Signed)
-----   Message from Arnoldo Lenis, MD sent at 07/29/2018  2:32 PM EDT ----- BP log looks fine, no changes   J BrancH MD

## 2018-08-12 ENCOUNTER — Other Ambulatory Visit: Payer: Self-pay | Admitting: Cardiology

## 2018-08-12 MED ORDER — METOPROLOL SUCCINATE ER 25 MG PO TB24: 25.0000 mg | ORAL_TABLET | Freq: Every day | ORAL | 1 refills | Status: DC

## 2018-08-12 NOTE — Telephone Encounter (Signed)
° °  1. Which medications need to be refilled? (please list name of each medication and dose if known) metoprolol succinate (TOPROL-XL) 25 MG 24 hr tablet [[id number]]    2. Which pharmacy/location (including street and city if local pharmacy) is medication to be sent to?   [facility]   3. Do they need a 30 day or 90 day supply?

## 2018-08-12 NOTE — Telephone Encounter (Signed)
Medication sent to pharmacy  

## 2018-10-09 ENCOUNTER — Other Ambulatory Visit: Payer: Self-pay | Admitting: Obstetrics & Gynecology

## 2018-10-12 NOTE — Telephone Encounter (Signed)
Medication refill request: Prempro Last AEX:  09/17/17 SM Next AEX: 12/09/18 Last MMG (if hormonal medication request): 07/21/18 Bilateral MMG/Left Breast US - BIRADS 2 benign/density d Refill authorized: Order pended #28 w/2 refills until patient's AEX if authorized

## 2018-11-30 ENCOUNTER — Other Ambulatory Visit: Payer: Self-pay | Admitting: Obstetrics & Gynecology

## 2018-11-30 NOTE — Telephone Encounter (Signed)
Medication refill request: Vitamin D Last AEX:  09-17-17 SM  Next AEX: 12-09-2018 Last MMG (if hormonal medication request): n/a Refill authorized: Today, please advise.   Medication pended for #4, 0RF. Please refill if appropriate.

## 2018-12-07 ENCOUNTER — Other Ambulatory Visit: Payer: Self-pay

## 2018-12-09 ENCOUNTER — Other Ambulatory Visit (HOSPITAL_COMMUNITY)
Admission: RE | Admit: 2018-12-09 | Discharge: 2018-12-09 | Disposition: A | Discharge status: Home / Self Care | Payer: Medicare Other | Source: Ambulatory Visit | Attending: Obstetrics & Gynecology | Admitting: Obstetrics & Gynecology

## 2018-12-09 ENCOUNTER — Encounter

## 2018-12-09 ENCOUNTER — Encounter: Payer: Self-pay | Admitting: Obstetrics & Gynecology

## 2018-12-09 ENCOUNTER — Telehealth: Payer: Self-pay | Admitting: Cardiology

## 2018-12-09 ENCOUNTER — Ambulatory Visit (INDEPENDENT_AMBULATORY_CARE_PROVIDER_SITE_OTHER): Payer: Medicare Other | Admitting: Obstetrics & Gynecology

## 2018-12-09 ENCOUNTER — Other Ambulatory Visit: Payer: Self-pay

## 2018-12-09 VITALS — BP 142/86 | HR 68 | Temp 97.7°F | Ht 63.0 in | Wt 132.0 lb

## 2018-12-09 DIAGNOSIS — R5383 Other fatigue: Secondary | ICD-10-CM | POA: Diagnosis not present

## 2018-12-09 DIAGNOSIS — E559 Vitamin D deficiency, unspecified: Secondary | ICD-10-CM | POA: Diagnosis not present

## 2018-12-09 DIAGNOSIS — Z124 Encounter for screening for malignant neoplasm of cervix: Secondary | ICD-10-CM | POA: Insufficient documentation

## 2018-12-09 DIAGNOSIS — Z01419 Encounter for gynecological examination (general) (routine) without abnormal findings: Secondary | ICD-10-CM | POA: Diagnosis not present

## 2018-12-09 DIAGNOSIS — E7800 Pure hypercholesterolemia, unspecified: Secondary | ICD-10-CM

## 2018-12-09 MED ORDER — AMLODIPINE BESYLATE 5 MG PO TABS: 5.0000 mg | ORAL_TABLET | Freq: Every day | ORAL | Status: DC

## 2018-12-09 MED ORDER — GABAPENTIN 100 MG PO CAPS: ORAL_CAPSULE | ORAL | 0 refills | Status: DC

## 2018-12-09 MED ORDER — PREMPRO 0.3-1.5 MG PO TABS: 1.0000 | ORAL_TABLET | Freq: Every day | ORAL | 2 refills | Status: DC

## 2018-12-09 MED ORDER — VITAMIN D (ERGOCALCIFEROL) 1.25 MG (50000 UNIT) PO CAPS: 50000.0000 [IU] | ORAL_CAPSULE | ORAL | 4 refills | Status: DC

## 2018-12-09 NOTE — Telephone Encounter (Signed)
Norvasc started in February - could almost tell instantly that she felt more fatigued after beginning this medication.  No other c/o chest pain, sob, or dizziness.  BP numbers have still been consistent with last log readings in epic.  Has 6 mo f/u scheduled for 01/19/2019.

## 2018-12-09 NOTE — Telephone Encounter (Signed)
Patient notified and verbalized understanding. 

## 2018-12-09 NOTE — Telephone Encounter (Signed)
Patient called stating that the medication Norvasc 5 mg is causing her fatigue States that she stays tired all the time.  [phone]

## 2018-12-09 NOTE — Progress Notes (Signed)
68 y.o. G2P1 Married White or Caucasian female here for annual exam.  She is now taking her HRT every other day.  She's having night sweats.  She wakes up at night with these.  D/w pt starting gabapentin.  She started on metoprolol and amlodipine due to increased heart rate and hypertension.  Followed by Dr. Harl Bowie.    Denies vaginal bleeding.    Reports she continues to have some intermittent nipple tenderness.  This has not worsened.    PCP:  Dr. Quintin Alto.    Patient's last menstrual period was 05/19/1990.          Sexually active: No The current method of family planning is post menopausal status.    Exercising: Yes.    The patient has a physically strenuous job, but has no regular exercise apart from work.  Smoker:  no  Health Maintenance: Pap:  06/17/16 Neg   03/30/14 Neg  History of abnormal Pap:  no MMG:  07/21/18 [place] Left BIRADS2:benign. F/u 1 year  Colonoscopy:  11/10/12 normal. F/u 10 years BMD:   03/30/13 osteopenia  TDaP:  2017 Pneumonia vaccine(s):  2018 Shingrix:   Completed  Hep C testing: 06/17/16 Neg  Screening Labs: here - not fasting    reports that she has never smoked. She has never used smokeless tobacco. She reports that she does not drink alcohol or use drugs.  Past Medical History:  Diagnosis Date  . Atypical moles   . GERD (gastroesophageal reflux disease)   . Melanoma ([place]) 1998  . Mixed incontinence   . Osteopenia     Past Surgical History:  Procedure Laterality Date  . APPENDECTOMY  1965  . BREAST BIOPSY Left 2011   fibrocystic changes  . CESAREAN SECTION    . excision of melanoma  1998   Dr Jon Gills  . TONSILLECTOMY  1955  . TUBAL LIGATION      Current Outpatient Medications  Medication Sig Dispense Refill  . amLODipine (NORVASC) 5 MG tablet Take 1 tablet (5 mg total) by mouth daily. 90 tablet 1  . b complex vitamins tablet Take 1 tablet by mouth daily.    . Loratadine (CLARITIN PO) Take 1 capsule by mouth.    . metoprolol succinate  (TOPROL-XL) 25 MG 24 hr tablet Take 1 tablet (25 mg total) by mouth daily. 90 tablet 1  . PREMPRO 0.3-1.5 MG tablet TAKE 1 TABLET BY MOUTH DAILY 28 tablet 2  . Vitamin D, Ergocalciferol, (DRISDOL) 1.25 MG (50000 UT) CAPS capsule TAKE ONE CAPSULE BY MOUTH EVERY 7 DAYS 4 capsule 0   No current facility-administered medications for this visit.     Family History  Problem Relation Age of Onset  . Hypertension Mother   . Kidney disease Mother   . Transient ischemic attack Mother   . Lung cancer Father   . Lung cancer Brother   . Heart disease Maternal Uncle   . Melanoma Brother     Review of Systems  All other systems reviewed and are negative.   Exam:   BP (!) 142/86   Pulse 68   Temp 97.7 F (36.5 C) (Temporal)   Ht 5\' 3"  (1.6 m)   Wt 132 lb (59.9 kg)   LMP 05/19/1990   BMI 23.38 kg/m     Height: 5\' 3"  (160 cm)  Ht Readings from Last 3 Encounters:  12/09/18 5\' 3"  (1.6 m)  07/19/18 5\' 4"  (1.626 m)  07/14/18 5\' 4"  (1.626 m)    General appearance:  alert, cooperative and appears stated age Head: Normocephalic, without obvious abnormality, atraumatic Neck: no adenopathy, supple, symmetrical, trachea midline and thyroid normal to inspection and palpation Lungs: clear to auscultation bilaterally Breasts: normal appearance, no masses or tenderness Heart: regular rate and rhythm Abdomen: soft, non-tender; bowel sounds normal; no masses,  no organomegaly Extremities: extremities normal, atraumatic, no cyanosis or edema Skin: Skin color, texture, turgor normal. No rashes or lesions Lymph nodes: Cervical, supraclavicular, and axillary nodes normal. No abnormal inguinal nodes palpated Neurologic: Grossly normal  Pelvic: External genitalia:  no lesions              Urethra:  normal appearing urethra with no masses, tenderness or lesions              Bartholins and Skenes: normal                 Vagina: normal appearing vagina with normal color and discharge, no lesions               Cervix: no lesions              Pap taken: Yes.   Bimanual Exam:  Uterus:  normal size, contour, position, consistency, mobility, non-tender              Adnexa: normal adnexa and no mass, fullness, tenderness               Rectovaginal: Confirms               Anus:  normal sphincter tone, no lesions  Chaperone was present for exam.  A:  Well Woman with normal exam PMP, on low dosed HRT H/o pre-melanoma Osteopenia Vit D deficiency Significant fatigue since starting amlodipine  P:   Mammogram guidelines reviewed.  Doing 3 due to breast density pap smear obtained today RF prem/pro 0.3/1.5mg  every other day.  #30/3RF.  Risks reviewed with pt.  She is going to try and taper off this year.  Will communicated with Cardiology about fatigue first.  Once adjustments are made, then will start gabpentin 100mg  qhs and increasing weekly by 100mg .  Will then try to fully taper off HRT. RF for Vit D 50K weekly.  #12/4RF CBC, CMP, Lipids, CBC, Vit D Plan BMD with MMG next year.  BMD order has been placed. Return annually or prn

## 2018-12-09 NOTE — Telephone Encounter (Signed)
Hold norvasc over the weekend and update [place] Monday. Few days of higher bp's is ok and will not be harmful   Zandra Abts MD

## 2018-12-10 LAB — COMPREHENSIVE METABOLIC PANEL
ALT: 12 IU/L (ref 0–32)
AST: 15 IU/L (ref 0–40)
Albumin/Globulin Ratio: 2 (ref 1.2–2.2)
Albumin: 4.7 g/dL (ref 3.8–4.8)
Alkaline Phosphatase: 52 IU/L (ref 39–117)
BUN/Creatinine Ratio: 23 (ref 12–28)
BUN: 16 mg/dL (ref 8–27)
Bilirubin Total: 0.4 mg/dL (ref 0.0–1.2)
CO2: 22 mmol/L (ref 20–29)
Calcium: 9.6 mg/dL (ref 8.7–10.3)
Chloride: 102 mmol/L (ref 96–106)
Creatinine, Ser: 0.69 mg/dL (ref 0.57–1.00)
GFR calc Af Amer: 102 mL/min/{1.73_m2} (ref >59)
GFR calc non Af Amer: 88 mL/min/{1.73_m2} (ref >59)
Globulin, Total: 2.4 g/dL (ref 1.5–4.5)
Glucose: 92 mg/dL (ref 65–99)
Potassium: 4.2 mmol/L (ref 3.5–5.2)
Sodium: 139 mmol/L (ref 134–144)
Total Protein: 7.1 g/dL (ref 6.0–8.5)

## 2018-12-10 LAB — CYTOLOGY - PAP: Diagnosis: NEGATIVE

## 2018-12-10 LAB — CBC
Hematocrit: 44.4 % (ref 34.0–46.6)
Hemoglobin: 14.6 g/dL (ref 11.1–15.9)
MCH: 31 pg (ref 26.6–33.0)
MCHC: 32.9 g/dL (ref 31.5–35.7)
MCV: 94 fL (ref 79–97)
Platelets: 273 10*3/uL (ref 150–450)
RBC: 4.71 x10E6/uL (ref 3.77–5.28)
RDW: 12.1 % (ref 11.7–15.4)
WBC: 7.6 10*3/uL (ref 3.4–10.8)

## 2018-12-10 LAB — LIPID PANEL
Chol/HDL Ratio: 3.9 ratio (ref 0.0–4.4)
Cholesterol, Total: 215 mg/dL — ABNORMAL HIGH (ref 100–199)
HDL: 55 mg/dL (ref >39)
LDL Calculated: 128 mg/dL — ABNORMAL HIGH (ref 0–99)
Triglycerides: 160 mg/dL — ABNORMAL HIGH (ref 0–149)
VLDL Cholesterol Cal: 32 mg/dL (ref 5–40)

## 2018-12-10 LAB — VITAMIN D 25 HYDROXY (VIT D DEFICIENCY, FRACTURES): Vit D, 25-Hydroxy: 34.9 ng/mL (ref 30.0–100.0)

## 2018-12-10 LAB — TSH: TSH: 1.21 u[IU]/mL (ref 0.450–4.500)

## 2018-12-13 ENCOUNTER — Telehealth: Payer: Self-pay | Admitting: Cardiology

## 2018-12-13 ENCOUNTER — Telehealth: Payer: Self-pay | Admitting: Obstetrics & Gynecology

## 2018-12-13 NOTE — Telephone Encounter (Signed)
Patient is calling to give information after seeing her cardiologist.

## 2018-12-13 NOTE — Telephone Encounter (Signed)
PATIENT calling with update since starting new medication

## 2018-12-13 NOTE — Telephone Encounter (Signed)
Held amlodipine throughout weekend and says her symptoms have improved a lot. Said fatigue is better.  BP today is 129/68 did not get heart rate.

## 2018-12-13 NOTE — Telephone Encounter (Signed)
Return call to patient. States cardiologist has her off Amlodipine and she is feeling quite a bit better.  Dr Sabra Heck was considering med change when she was feeling better.  Patient is concerned about Gabpentin due to her acid reflux and uses 5-6 Tums per day. No prescription treatment.  Would like to wean off Prempro without adding another medication if possible.  Routing to Dr Sabra Heck for review.

## 2018-12-13 NOTE — Telephone Encounter (Signed)
Patient informed and verbalized understanding of plan. 

## 2018-12-13 NOTE — Telephone Encounter (Signed)
Can stay off norvasc. Can she check her bp 3-4 times a week and update [place] in 2 weeks   J Imberly Troxler MD

## 2018-12-14 ENCOUNTER — Other Ambulatory Visit: Payer: Self-pay | Admitting: *Deleted

## 2018-12-14 DIAGNOSIS — E7800 Pure hypercholesterolemia, unspecified: Secondary | ICD-10-CM

## 2018-12-15 NOTE — Telephone Encounter (Signed)
I think she is taking this every other day.  If not, she should do this for 1 month and then decrease frequency to every 3 days for one month and then every 4 days for one month and then stop.  The risk of reflux with gabapentin is 1-2%, just so she knows this is a very low risk to worsen the symptoms she already has.  Thanks.

## 2018-12-15 NOTE — Telephone Encounter (Signed)
Call to patient. Advised of instructions from Dr Sabra Heck.  Is already on every other day. Will decrease to every third day. Prefers to try without gabapentin for now.   Encounter closed.

## 2018-12-20 ENCOUNTER — Telehealth: Payer: Self-pay | Admitting: Obstetrics & Gynecology

## 2018-12-20 MED ORDER — TERCONAZOLE 0.4 % VA CREA: 1.0000 | TOPICAL_CREAM | Freq: Every day | VAGINAL | 0 refills | Status: DC

## 2018-12-20 NOTE — Telephone Encounter (Signed)
Rx signed.  Ok to close encounter.  Thanks. 

## 2018-12-20 NOTE — Telephone Encounter (Signed)
Patient notified of Rx.   Encounter closed.

## 2018-12-20 NOTE — Telephone Encounter (Signed)
Spoke with patient. 12/09/18 pap positive for yeast, patient requesting Rx. Reports symptoms did not start until over the weekend, reports vaginal itching. Denies vaginal d/c or odor. Patient request vaginal cream instead of PO diflucan. Advised I will review with Dr. Sabra Heck and return call, pharmacy confirmed.   Rx pended for Terazol 7.   Routing to Dr. Sabra Heck to review.

## 2018-12-20 NOTE — Telephone Encounter (Signed)
Patient calling to have a prescription for yeast infection called in. She said she was told to call for prescription if she has problems.

## 2018-12-22 ENCOUNTER — Other Ambulatory Visit (INDEPENDENT_AMBULATORY_CARE_PROVIDER_SITE_OTHER): Payer: Medicare Other

## 2018-12-22 ENCOUNTER — Other Ambulatory Visit: Payer: Self-pay

## 2018-12-22 VITALS — BP 120/82 | HR 68 | Temp 97.2°F | Resp 16 | Wt 132.0 lb

## 2018-12-22 DIAGNOSIS — Z0130 Encounter for examination of blood pressure without abnormal findings: Secondary | ICD-10-CM

## 2018-12-22 DIAGNOSIS — E7800 Pure hypercholesterolemia, unspecified: Secondary | ICD-10-CM

## 2018-12-22 NOTE — Progress Notes (Signed)
Patient came in for blood pressure check & labwork. Pt state she feels fine with no problems.

## 2018-12-23 LAB — LIPID PANEL
Chol/HDL Ratio: 3.7 ratio (ref 0.0–4.4)
Cholesterol, Total: 196 mg/dL (ref 100–199)
HDL: 53 mg/dL (ref >39)
LDL Calculated: 116 mg/dL — ABNORMAL HIGH (ref 0–99)
Triglycerides: 134 mg/dL (ref 0–149)
VLDL Cholesterol Cal: 27 mg/dL (ref 5–40)

## 2018-12-27 ENCOUNTER — Telehealth: Payer: Self-pay | Admitting: Cardiology

## 2018-12-27 NOTE — Telephone Encounter (Signed)
BP has been running between 120s-130s/60s-80s HR has been in the 60s - did not records 3-4 times weekly, only took an average since stopping Amlodipine and says she no longer has symptoms of fatigue

## 2018-12-27 NOTE — Telephone Encounter (Signed)
Thanks for update, numbers look good. Can we list amlodopine as allergy with fatigue. No med changes, stay off norvasc   Zandra Abts MD

## 2018-12-27 NOTE — Telephone Encounter (Signed)
Updated allergy list - pt voiced understanding - has f/u in September

## 2018-12-27 NOTE — Telephone Encounter (Signed)
Checking in with BP updates

## 2018-12-28 ENCOUNTER — Other Ambulatory Visit: Payer: Self-pay | Admitting: Cardiology

## 2019-01-07 ENCOUNTER — Other Ambulatory Visit: Payer: Self-pay | Admitting: Obstetrics & Gynecology

## 2019-01-13 ENCOUNTER — Telehealth: Payer: Self-pay | Admitting: *Deleted

## 2019-01-13 NOTE — Telephone Encounter (Signed)
Patient verbally consented for tele-health visits with [facility] and understands that her insurance company will be billed for the encounter.  Aware to have vitals available   

## 2019-01-19 ENCOUNTER — Encounter: Payer: Self-pay | Admitting: Cardiology

## 2019-01-19 ENCOUNTER — Telehealth (INDEPENDENT_AMBULATORY_CARE_PROVIDER_SITE_OTHER): Payer: Medicare Other | Admitting: Cardiology

## 2019-01-19 VITALS — BP 151/87 | HR 70 | Ht 63.5 in | Wt 132.0 lb

## 2019-01-19 DIAGNOSIS — R002 Palpitations: Secondary | ICD-10-CM

## 2019-01-19 DIAGNOSIS — I10 Essential (primary) hypertension: Secondary | ICD-10-CM

## 2019-01-19 NOTE — Progress Notes (Signed)
Virtual Visit via Telephone Note   This visit type was conducted due to national recommendations for restrictions regarding the COVID-19 Pandemic (e.g. social distancing) in an effort to limit this patient's exposure and mitigate transmission in our community.  Due to her co-morbid illnesses, this patient is at least at moderate risk for complications without adequate follow up.  This format is felt to be most appropriate for this patient at this time.  The patient did not have access to video technology/had technical difficulties with video requiring transitioning to audio format only (telephone).  All issues noted in this document were discussed and addressed.  No physical exam could be performed with this format.  Please refer to the patient's chart for her  consent to telehealth for [facility].   Date:  01/19/2019   ID:  Jamie Duke, DOB [birth year], MRN [id number]  Patient Location: Home Provider Location: Office  PCP:  Manon Hilding, MD  Cardiologist:  Carlyle Dolly, MD  Electrophysiologist:  None   Evaluation Performed:  Follow-Up Visit  Chief Complaint:  Palpitations  History of Present Illness:    Jamie Duke is a 68 y.o. female seen today for follow up of the following medical problems.     1. Palpitations -ongoing several years.Variable in frequency. Sometimes can happen several times a day for several days in a row, sometimes can go weeks without symptoms. Mainly occurs at night. feeling of heart racing, lasts less than 1 minute. No other associated symptoms. - no coffee, decaf tea, limited sodas, no energy drinks, no EtOH   Jan 2020 monitor without arrhythmias - pcp started toprol 25mg .Symptoms improved on beta blocker.   - rare mild palpitations at times, overall doing well   2. Elevated blood pressure - noted last visit, does not carry diagnosis of HTN as of yet - home bp's remain elevated since last visit even on Toprol. Some insomnia  since starting beta blocker but overall tolerable   - last visit we staretd norvasc 5mg  daily. Reports significant fatigue after starting, she stopped taking after contacting us. Off norvasc bp's per report in August were 120s-130s/60s-80s   -continues to check bp. Typically 130s/80s. 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    Past Medical History:  Diagnosis Date  . Atypical moles   . GERD (gastroesophageal reflux disease)   . Melanoma ([place]) 1998  . Mixed incontinence   . Osteopenia    Past Surgical History:  Procedure Laterality Date  . APPENDECTOMY  1965  . BREAST BIOPSY Left 2011   fibrocystic changes  . CESAREAN SECTION    . excision of melanoma  1998   Dr Jon Gills  . TONSILLECTOMY  1955  . TUBAL LIGATION       Current Meds  Medication Sig  . b complex vitamins tablet Take 1 tablet by mouth daily.  Marland Kitchen estrogen, conjugated,-medroxyprogesterone (PREMPRO) 0.3-1.5 MG tablet Take 1 tablet by mouth. Takes every 3rd day.  . Loratadine (CLARITIN PO) Take 1 capsule by mouth.  . metoprolol succinate (TOPROL-XL) 25 MG 24 hr tablet Take 1 tablet (25 mg total) by mouth daily.  . Vitamin D, Ergocalciferol, (DRISDOL) 1.25 MG (50000 UT) CAPS capsule Take 1 capsule (50,000 Units total) by mouth every 7 (seven) days.     Allergies:   Amlodipine  Social History   Tobacco Use  . Smoking status: Never Smoker  . Smokeless tobacco: Never Used  Substance Use Topics  . Alcohol use: No  . Drug use: No     Family Hx: The patient's family history includes Heart disease in her maternal uncle; Hypertension in her mother; Kidney disease in her mother; Lung cancer in her brother and father; Melanoma in her brother; Transient ischemic attack in her mother.  ROS:   Please see the history of present  illness.     All other systems reviewed and are negative.   Prior CV studies:   The following studies were reviewed today:  Jan 2020 monitor  21 day event monitor  Min HR 61, Max HR 129, Avg HR 79  No symptoms reported  Telemetry shows normal sinus rhythm, no significant arrhythmias  Labs/Other Tests and Data Reviewed:    EKG:  No ECG reviewed.  Recent Labs: 12/09/2018: ALT 12; BUN 16; Creatinine, Ser 0.69; Hemoglobin 14.6; Platelets 273; Potassium 4.2; Sodium 139; TSH 1.210   Recent Lipid Panel Lab Results  Component Value Date/Time   CHOL 196 12/22/2018 08:22 AM   TRIG 134 12/22/2018 08:22 AM   HDL 53 12/22/2018 08:22 AM   CHOLHDL 3.7 12/22/2018 08:22 AM   CHOLHDL 3.5 06/17/2016 09:26 AM   LDLCALC 116 (H) 12/22/2018 08:22 AM   LDLDIRECT 128.0 08/23/2012 03:27 PM    Wt Readings from Last 3 Encounters:  01/19/19 132 lb (59.9 kg)  12/22/18 132 lb (59.9 kg)  12/09/18 132 lb (59.9 kg)     Objective:    Vital Signs:  BP (!) 151/87   Pulse 70   Ht 5' 3.5" (1.613 m)   Wt 132 lb (59.9 kg)   LMP 05/19/1990   BMI 23.02 kg/m    Normal affect. Normal speech pattern and tone. Comfortable, no apparent distress. No audible signs of SOB or wheezing.   ASSESSMENT & PLAN:    1. Palpitations - benign event monitor, has done well on low dose toprol - continue current meds  2. HTN - side effects on norvasc and stopped taking, off norvasc bp's have been reasonable. Elevated initially today but her recheck was at goal 130s/80s and overall trends have been at goal - continue current meds     COVID-19 Education: The signs and symptoms of COVID-19 were discussed with the patient and how to seek care for testing (follow up with PCP or arrange E-visit).  The importance of social distancing was discussed today.  Time:   Today, I have spent 13 minutes with the patient with telehealth technology discussing the above problems.     Medication Adjustments/Labs and Tests  Ordered: Current medicines are reviewed at length with the patient today.  Concerns regarding medicines are outlined above.   Tests Ordered: No orders of the defined types were placed in this encounter.   Medication Changes: No orders of the defined types were placed in this encounter.   Follow Up:  In Person in 1 year(s)  Signed, Carlyle Dolly, MD  01/19/2019 9:00 AM    [facility]

## 2019-01-19 NOTE — Patient Instructions (Signed)

## 2019-02-12 ENCOUNTER — Other Ambulatory Visit: Payer: Self-pay | Admitting: Cardiology

## 2019-02-16 DIAGNOSIS — Z23 Encounter for immunization: Secondary | ICD-10-CM | POA: Diagnosis not present

## 2019-04-13 ENCOUNTER — Other Ambulatory Visit: Payer: Self-pay

## 2019-06-24 DIAGNOSIS — Z23 Encounter for immunization: Secondary | ICD-10-CM | POA: Diagnosis not present

## 2019-07-23 DIAGNOSIS — Z23 Encounter for immunization: Secondary | ICD-10-CM | POA: Diagnosis not present

## 2019-07-28 DIAGNOSIS — D100 Benign neoplasm of lip: Secondary | ICD-10-CM | POA: Diagnosis not present

## 2019-07-28 DIAGNOSIS — K1370 Unspecified lesions of oral mucosa: Secondary | ICD-10-CM | POA: Diagnosis not present

## 2019-08-05 ENCOUNTER — Other Ambulatory Visit: Payer: Self-pay | Admitting: Cardiology

## 2019-08-17 ENCOUNTER — Ambulatory Visit (INDEPENDENT_AMBULATORY_CARE_PROVIDER_SITE_OTHER): Payer: Medicare Other | Admitting: Otolaryngology

## 2019-08-17 ENCOUNTER — Other Ambulatory Visit: Payer: Self-pay

## 2019-08-17 VITALS — Temp 97.3°F

## 2019-08-17 DIAGNOSIS — K219 Gastro-esophageal reflux disease without esophagitis: Secondary | ICD-10-CM | POA: Diagnosis not present

## 2019-08-17 DIAGNOSIS — H6121 Impacted cerumen, right ear: Secondary | ICD-10-CM

## 2019-08-17 NOTE — Progress Notes (Signed)
HPI: Jamie Duke is a 69 y.o. female who presents for evaluation of blockage of her hearing predominately in the right ear.  She hears fairly well out of the left ear.  She also has history of reflux with history of hiatal hernia.  She has been having some heartburn and some mild trouble swallowing.  She does not have any hoarseness.  No sore throat.  Past Medical History:  Diagnosis Date  . Atypical moles   . GERD (gastroesophageal reflux disease)   . Melanoma ([place]) 1998  . Mixed incontinence   . Osteopenia    Past Surgical History:  Procedure Laterality Date  . APPENDECTOMY  1965  . BREAST BIOPSY Left 2011   fibrocystic changes  . CESAREAN SECTION    . excision of melanoma  1998   Dr Jon Gills  . TONSILLECTOMY  1955  . TUBAL LIGATION     Social History   Socioeconomic History  . Marital status: Married    Spouse name: Not on file  . Number of children: 1  . Years of education: Not on file  . Highest education level: Not on file  Occupational History  . Occupation: Environmental consultant  Tobacco Use  . Smoking status: Never Smoker  . Smokeless tobacco: Never Used  Substance and Sexual Activity  . Alcohol use: No  . Drug use: No  . Sexual activity: Not Currently    Birth control/protection: Surgical  Other Topics Concern  . 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 Difficulty of Paying Living Expenses:   Food Insecurity:   . Worried About Charity fundraiser in the Last Year:   . Arboriculturist in the Last Year:   Transportation Needs:   . Film/video editor (Medical):   Marland Kitchen Lack of Transportation (Non-Medical):   Physical Activity:   . Days of Exercise per Week:   . Minutes of Exercise per Session:   Stress:   . Feeling of Stress :   Social Connections:   . Frequency of Communication with Friends and Family:   . Frequency of Social  Gatherings with Friends and Family:   . Attends Religious Services:   . Active Member of Clubs or Organizations:   . Attends Archivist Meetings:   Marland Kitchen Marital Status:    Family History  Problem Relation Age of Onset  . Hypertension Mother   . Kidney disease Mother   . Transient ischemic attack Mother   . Lung cancer Father   . Lung cancer Brother   . Heart disease Maternal Uncle   . Melanoma Brother    Allergies  Allergen Reactions  . Amlodipine     FATIGUE    Prior to Admission medications   Medication Sig Start Date End Date Taking? Authorizing Provider  b complex vitamins tablet Take 1 tablet by mouth daily.    [provider]  estrogen, conjugated,-medroxyprogesterone (PREMPRO) 0.3-1.5 MG tablet Take 1 tablet by mouth. Takes every 3rd day.    [provider]  Loratadine (CLARITIN PO) Take 1 capsule by mouth.    [provider]  metoprolol succinate (TOPROL-XL) 25 MG 24 hr tablet TAKE 1 TABLET BY MOUTH EVERY DAY 08/05/19   Arnoldo Lenis, MD  Vitamin D, Ergocalciferol, (DRISDOL) 1.25 MG (50000 UT) CAPS capsule Take 1  capsule (50,000 Units total) by mouth every 7 (seven) days. 12/09/18   Megan Salon, MD     Positive ROS: Otherwise negative  All other systems have been reviewed and were otherwise negative with the exception of those mentioned in the HPI and as above.  Physical Exam: Constitutional: Alert, well-appearing, no acute distress Ears: External ears without lesions or tenderness.  The right ear canal is completely impacted with cerumen that was removed with suction and forceps.  The TM was clear.  Left ear canal had minimal cerumen and left TM was clear.  On hearing screening after cleaning the ears hearing was symmetric in both ears although she has some mild hearing loss with the 1024 tuning fork in both ears. Nasal: External nose without lesions. Clear nasal passages Oral: Lips and gums without lesions. Tongue and palate  mucosa without lesions. Posterior oropharynx clear. Neck: No palpable adenopathy or masses Respiratory: Breathing comfortably  Skin: No facial/neck lesions or rash noted.  Cerumen impaction removal  Date/Time: 08/17/2019 12:58 PM Performed by: Rozetta Nunnery, MD Authorized by: Rozetta Nunnery, MD   Consent:    Consent obtained:  Verbal   Consent given by:  Patient   Risks discussed:  Pain and bleeding Procedure details:    Location:  R ear Post-procedure details:    Inspection:  TM intact and canal normal   Hearing quality:  Improved   Patient tolerance of procedure:  Tolerated well, no immediate complications Comments:     TMs are clear bilaterally.    Assessment: Right ear cerumen impaction Symptoms of reflux disease.  Plan: Ear canal was cleaned in the office today with improved hearing. Prescribed omeprazole 40 mg daily before dinner for the next 30 days and recommended follow-up with gastroenterology for further evaluation of trouble swallowing if this persists.  Radene Journey, MD

## 2019-08-23 DIAGNOSIS — H524 Presbyopia: Secondary | ICD-10-CM | POA: Diagnosis not present

## 2019-08-23 DIAGNOSIS — H2513 Age-related nuclear cataract, bilateral: Secondary | ICD-10-CM | POA: Diagnosis not present

## 2019-09-07 ENCOUNTER — Other Ambulatory Visit: Payer: Self-pay

## 2019-09-07 ENCOUNTER — Ambulatory Visit (INDEPENDENT_AMBULATORY_CARE_PROVIDER_SITE_OTHER): Payer: Medicare Other | Admitting: Otolaryngology

## 2019-09-07 ENCOUNTER — Encounter (INDEPENDENT_AMBULATORY_CARE_PROVIDER_SITE_OTHER): Payer: Self-pay | Admitting: Otolaryngology

## 2019-09-07 VITALS — Temp 97.3°F

## 2019-09-07 DIAGNOSIS — K219 Gastro-esophageal reflux disease without esophagitis: Secondary | ICD-10-CM

## 2019-09-07 DIAGNOSIS — H903 Sensorineural hearing loss, bilateral: Secondary | ICD-10-CM | POA: Diagnosis not present

## 2019-09-07 NOTE — Progress Notes (Signed)
HPI: Jamie Duke is a 69 y.o. female who returns today for evaluation of gastroesophageal reflux disease and hearing evaluation.  Patient had a hearing test performed today which demonstrated essentially normal hearing in both ears with slight decreased hearing in the upper frequencies around 4000. She was prescribed omeprazole and has done much better since taking the omeprazole.  She reduced the omeprazole to every third day and feels like this controls most of her symptoms she also slows down eating.  She had previously had some trouble swallowing but this is doing better.  If she has any persistent trouble swallowing or chest discomfort would recommend evaluation with gastroenterology and reviewed this with her..  Patient states that she has a hiatal hernia.  Past Medical History:  Diagnosis Date  . Atypical moles   . GERD (gastroesophageal reflux disease)   . Melanoma ([place]) 1998  . Mixed incontinence   . Osteopenia    Past Surgical History:  Procedure Laterality Date  . APPENDECTOMY  1965  . BREAST BIOPSY Left 2011   fibrocystic changes  . CESAREAN SECTION    . excision of melanoma  1998   Dr Jon Gills  . TONSILLECTOMY  1955  . TUBAL LIGATION     Social History   Socioeconomic History  . Marital status: Married    Spouse name: Not on file  . Number of children: 1  . Years of education: Not on file  . Highest education level: Not on file  Occupational History  . Occupation: Environmental consultant  Tobacco Use  . Smoking status: Never Smoker  . Smokeless tobacco: Never Used  Substance and Sexual Activity  . Alcohol use: No  . Drug use: No  . Sexual activity: Not Currently    Birth control/protection: Surgical  Other Topics Concern  . 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 Difficulty of Paying Living Expenses:   Food Insecurity:   . Worried  About Charity fundraiser in the Last Year:   . Arboriculturist in the Last Year:   Transportation Needs:   . Film/video editor (Medical):   Marland Kitchen Lack of Transportation (Non-Medical):   Physical Activity:   . Days of Exercise per Week:   . Minutes of Exercise per Session:   Stress:   . Feeling of Stress :   Social Connections:   . Frequency of Communication with Friends and Family:   . Frequency of Social Gatherings with Friends and Family:   . Attends Religious Services:   . Active Member of Clubs or Organizations:   . Attends Archivist Meetings:   Marland Kitchen Marital Status:    Family History  Problem Relation Age of Onset  . Hypertension Mother   . Kidney disease Mother   . Transient ischemic attack Mother   . Lung cancer Father   . Lung cancer Brother   . Heart disease Maternal Uncle   . Melanoma Brother    Allergies  Allergen Reactions  . Amlodipine     FATIGUE    Prior to Admission medications   Medication Sig Start Date End Date Taking? Authorizing Provider  b complex vitamins tablet Take 1 tablet by mouth daily.   Yes [provider]  estrogen, conjugated,-medroxyprogesterone (PREMPRO) 0.3-1.5 MG tablet Take 1 tablet by mouth. Takes every 3rd  day.   Yes [provider]  Loratadine (CLARITIN PO) Take 1 capsule by mouth.   Yes [provider]  metoprolol succinate (TOPROL-XL) 25 MG 24 hr tablet TAKE 1 TABLET BY MOUTH EVERY DAY 08/05/19  Yes Branch, Alphonse Guild, MD  Vitamin D, Ergocalciferol, (DRISDOL) 1.25 MG (50000 UT) CAPS capsule Take 1 capsule (50,000 Units total) by mouth every 7 (seven) days. 12/09/18  Yes Megan Salon, MD     Positive ROS: Otherwise negative  All other systems have been reviewed and were otherwise negative with the exception of those mentioned in the HPI and as above.  Physical Exam: Constitutional: Alert, well-appearing, no acute distress Ears: External ears without lesions or tenderness. Ear canals are clear  bilaterally with intact, clear TMs.  She has minimal wax buildup in the ear canals today.  TMs are clear bilaterally. Nasal: External nose without lesions. Clear nasal passages Oral: Lips and gums without lesions. Tongue and palate mucosa without lesions. Posterior oropharynx clear. Neck: No palpable adenopathy or masses Respiratory: Breathing comfortably  Skin: No facial/neck lesions or rash noted.  Audiogram demonstrated essentially normal hearing with slight symmetric high-frequency sensorineural hearing loss.  SRT's were 15 dB bilaterally.  She had type A tympanograms bilaterally.  Procedures   Assessment: History of GERD History of cerumen buildup  Plan: She will follow-up as needed any further problems with her ear or hearing. If she has persistent GERD suggested follow-up with gastroenterology or if she has any difficulty with swallowing.   Radene Journey, MD

## 2019-09-12 ENCOUNTER — Encounter (INDEPENDENT_AMBULATORY_CARE_PROVIDER_SITE_OTHER): Payer: Self-pay

## 2019-09-28 DIAGNOSIS — L570 Actinic keratosis: Secondary | ICD-10-CM | POA: Diagnosis not present

## 2019-09-28 DIAGNOSIS — D100 Benign neoplasm of lip: Secondary | ICD-10-CM | POA: Diagnosis not present

## 2019-09-28 DIAGNOSIS — D239 Other benign neoplasm of skin, unspecified: Secondary | ICD-10-CM | POA: Diagnosis not present

## 2019-11-09 ENCOUNTER — Encounter (INDEPENDENT_AMBULATORY_CARE_PROVIDER_SITE_OTHER): Payer: Self-pay

## 2019-11-09 NOTE — Progress Notes (Unsigned)
On 11/03/2019 I faxed a Rx refill request to [facility], [place]. Rx was Omeprazole 40mg , Qty 30 plus 2 refills. Take 1 caps. By mouth daily before dinner.

## 2019-11-10 ENCOUNTER — Other Ambulatory Visit: Payer: Self-pay | Admitting: Obstetrics & Gynecology

## 2019-11-10 DIAGNOSIS — Z1231 Encounter for screening mammogram for malignant neoplasm of breast: Secondary | ICD-10-CM

## 2019-12-14 ENCOUNTER — Ambulatory Visit
Admission: RE | Admit: 2019-12-14 | Discharge: 2019-12-14 | Disposition: A | Discharge status: Home / Self Care | Payer: Medicare Other | Source: Ambulatory Visit | Attending: Obstetrics & Gynecology | Admitting: Obstetrics & Gynecology

## 2019-12-14 DIAGNOSIS — Z1231 Encounter for screening mammogram for malignant neoplasm of breast: Secondary | ICD-10-CM | POA: Diagnosis not present

## 2020-01-04 ENCOUNTER — Other Ambulatory Visit: Payer: Self-pay | Admitting: Obstetrics & Gynecology

## 2020-01-04 NOTE — Telephone Encounter (Signed)
Medication refill request: vitamin d  Last AEX:  12-09-2018 SM  Next AEX: 02-16-20 Last MMG (if hormonal medication request): 12-14-19 density D/BIRADS 1 negative  Refill authorized: Today, please advise.   Medication pended for #12, 0RF. Please refill if appropriate.

## 2020-01-06 DIAGNOSIS — R05 Cough: Secondary | ICD-10-CM | POA: Diagnosis not present

## 2020-01-06 DIAGNOSIS — Z20828 Contact with and (suspected) exposure to other viral communicable diseases: Secondary | ICD-10-CM | POA: Diagnosis not present

## 2020-02-01 NOTE — Progress Notes (Signed)
Cardiology Office Note  Date: 02/02/2020   ID: Jamie Duke 1950/08/15, MRN [id number]  PCP:  Manon Hilding, MD  Cardiologist:  Carlyle Dolly, MD Electrophysiologist:  None   Chief Complaint: Follow-up palpitations  History of Present Illness: Jamie Duke is a 69 y.o. female with a history of palpitations, elevated blood pressure without diagnosis of hypertension.  Last encounter via telemedicine with Dr. Harl Bowie on 01/19/2019.  Patient had palpitations that have been ongoing for several years which were variable in frequency.  Could happen several times a day for several days in a row.  She could go weeks without symptoms.  Mainly occurs at night.  Felt as though her heart was racing.  Lasting less than a minute.  Had a cardiac monitor in January 2020 without any arrhythmias.  Her primary care provider started her on Toprol 25 mg.  Symptoms improved.  Blood pressure had been elevated in spite of being on a beta-blocker.  Norvasc was started at 5 mg daily.  She eventually stopped due to significant fatigue.  Blood pressures had recently been at goal.   She is here for 1 year follow-up.  She recently had Covid pneumonia in spite of having the vaccine.  States she still trying to get over the age pneumonia.  Still feels a little weak.  Blood pressure is elevated today on arrival.  Recheck in right arm was again 160/80.  States she has been checking her blood pressures at home.  She denies any anginal or exertional symptoms.  Palpitations occur very rarely.  Denies any orthostatic symptoms, PND, orthopnea, CVA/TIA-like symptoms, bleeding issues, claudication-like symptoms, DVT or PE-like symptoms, lower extremity edema.  She was taking amlodipine but did not tolerate and stopped.  Past Medical History:  Diagnosis Date  . Atypical moles   . GERD (gastroesophageal reflux disease)   . Melanoma ([place]) 1998  . Mixed incontinence   . Osteopenia     Past Surgical History:   Procedure Laterality Date  . APPENDECTOMY  1965  . BREAST BIOPSY Left 2011   fibrocystic changes  . CESAREAN SECTION    . excision of melanoma  1998   Dr Jon Gills  . TONSILLECTOMY  1955  . TUBAL LIGATION      Current Outpatient Medications  Medication Sig Dispense Refill  . b complex vitamins tablet Take 1 tablet by mouth daily.    Marland Kitchen estrogen, conjugated,-medroxyprogesterone (PREMPRO) 0.3-1.5 MG tablet Take 1 tablet by mouth. Takes every 3rd day.    . Loratadine (CLARITIN PO) Take 1 capsule by mouth.    . metoprolol succinate (TOPROL-XL) 25 MG 24 hr tablet TAKE 1 TABLET BY MOUTH EVERY DAY 90 tablet 3  . omeprazole (PRILOSEC) 40 MG capsule Take 1 capsule by mouth 2 (two) times a week.    . Vitamin D, Ergocalciferol, (DRISDOL) 1.25 MG (50000 UNIT) CAPS capsule TAKE ONE CAPSULE BY MOUTH EVERY SEVEN DAYS 12 capsule 0   No current facility-administered medications for this visit.   Allergies:  Amlodipine   Social History: The patient  reports that she has never smoked. She has never used smokeless tobacco. She reports that she does not drink alcohol and does not use drugs.   Family History: The patient's family history includes Heart disease in her maternal uncle; Hypertension in her mother; Kidney disease in her mother; Lung cancer in her brother and father; Melanoma in her brother; Transient ischemic attack in her mother.   ROS:  Please see the  history of present illness. Otherwise, complete review of systems is positive for none.  All other systems are reviewed and negative.   Physical Exam: VS:  BP (!) 160/90   Pulse 70   Ht 5\' 4"  (1.626 m)   Wt 132 lb 3.2 oz (60 kg)   LMP 05/19/1990   SpO2 96%   BMI 22.69 kg/m , BMI Body mass index is 22.69 kg/m.  Wt Readings from Last 3 Encounters:  02/02/20 132 lb 3.2 oz (60 kg)  01/19/19 132 lb (59.9 kg)  12/22/18 132 lb (59.9 kg)    General: Patient appears comfortable at res  Neck: Supple, no elevated JVP or carotid bruits, no  thyromegaly. Lungs: Clear to auscultation, nonlabored breathing at rest. Cardiac: Regular rate and rhythm, no S3 or significant systolic murmur, no pericardial rub. Extremities: No pitting edema, distal pulses 2+. Skin: Warm and dry. Musculoskeletal: No kyphosis. Neuropsychiatric: Alert and oriented x3, affect grossly appropriate.  ECG:    Recent Labwork: No results found for requested labs within last 8760 hours.     Component Value Date/Time   CHOL 196 12/22/2018 0822   TRIG 134 12/22/2018 0822   HDL 53 12/22/2018 0822   CHOLHDL 3.7 12/22/2018 0822   CHOLHDL 3.5 06/17/2016 0926   VLDL 29 06/17/2016 0926   LDLCALC 116 (H) 12/22/2018 0822   LDLDIRECT 128.0 08/23/2012 1527    Other Studies Reviewed Today:  Jan 2020 monitor  21 day event monitor  Min HR 61, Max HR 129, Avg HR 79  No symptoms reported  Telemetry shows normal sinus rhythm, no significant arrhythmias  Assessment and Plan:  1. Palpitations   2. Essential hypertension    1. Palpitations States she is having very rare palpitations which are not very short-lived.  Palpitations have significantly improved since taking Toprol-XL 25 mg daily.  Continue Toprol-XL 25 mg p.o. daily.  2. Essential hypertension Blood pressure is elevated today at 160/90.  Recheck in right arm 160/80.  Start lisinopril 10 mg daily.  Begin checking your blood pressures every day for the next 2 to 3 weeks.  Get a BMP and magnesium in 2 weeks after starting lisinopril.  Bring the log of blood pressures at follow-up.  We will follow-up in 1 month  Medication Adjustments/Labs and Tests Ordered: Current medicines are reviewed at length with the patient today.  Concerns regarding medicines are outlined above.   Disposition: Follow-up with Dr. Harl Bowie or APP 1 month. Signed, Levell July, NP 02/02/2020 8:39 AM    [facility], [place], [place] [address] Phone: [phone]; Fax: [phone]

## 2020-02-02 ENCOUNTER — Other Ambulatory Visit: Payer: Self-pay

## 2020-02-02 ENCOUNTER — Encounter: Payer: Self-pay | Admitting: Family Medicine

## 2020-02-02 ENCOUNTER — Ambulatory Visit (INDEPENDENT_AMBULATORY_CARE_PROVIDER_SITE_OTHER): Payer: Medicare Other | Admitting: Family Medicine

## 2020-02-02 VITALS — BP 160/90 | HR 70 | Ht 64.0 in | Wt 132.2 lb

## 2020-02-02 DIAGNOSIS — R002 Palpitations: Secondary | ICD-10-CM

## 2020-02-02 DIAGNOSIS — I10 Essential (primary) hypertension: Secondary | ICD-10-CM

## 2020-02-02 MED ORDER — LISINOPRIL 10 MG PO TABS: 10.0000 mg | ORAL_TABLET | Freq: Every day | ORAL | 6 refills | Status: DC

## 2020-02-02 NOTE — Patient Instructions (Signed)
Medication Instructions:   Begin Lisinopril 10mg  daily.   Continue all other medications.    Labwork:  BMET, Magnesium - orders given today.  Please do in 2 weeks.   Office will contact with results via phone or letter.    Testing/Procedures: none  Follow-Up: 1 month   Any Other Special Instructions Will Be Listed Below (If Applicable). Your physician has requested that you regularly monitor and record your blood pressure readings at home. Please use the same machine at the same time of day to check your readings and record them to bring to your follow-up visit.  If you need a refill on your cardiac medications before your next appointment, please call your pharmacy.

## 2020-02-02 NOTE — Addendum Note (Signed)
Addended by: Laurine Blazer on: 02/02/2020 08:48 AM   Modules accepted: Orders

## 2020-02-08 DIAGNOSIS — Z23 Encounter for immunization: Secondary | ICD-10-CM | POA: Diagnosis not present

## 2020-02-10 ENCOUNTER — Telehealth: Payer: Self-pay | Admitting: Family Medicine

## 2020-02-10 NOTE — Telephone Encounter (Signed)
Pt will decrease lisinopril to 5 mg for now and update [place] if symptoms don't resolve

## 2020-02-10 NOTE — Telephone Encounter (Signed)
Pt c/o headache/dizziness and off balance since starting lisinopril  BP this morning was 124/76 HR 70-80s

## 2020-02-10 NOTE — Telephone Encounter (Signed)
Patient called stating that medication Lisinopril 10 mg is causing her to have a headache and off balance.

## 2020-02-10 NOTE — Telephone Encounter (Signed)
Okay call her and tell her she can either decrease the medication to half and take 5 mg and see if this resolves the symptoms.  If this does not resolve the symptoms then discontinue the medication.

## 2020-02-13 NOTE — Progress Notes (Signed)
69 y.o. G2P1 Married White or Caucasian female here for breast and pelvic exam.  I am also following her for her bone density and HRT.  She did not go for her BMD due to Covid but will plan to do this when she is feeling better.   She really tried to stop her HRT.  When takes every other day, has some hot flashes but there are mild.  If takes every 3rd day, feels miserable.  She is clearly aware of risks but desires to continue.  Had Covid and had some elevated BP while having Covid.  Is on lisinopril.  Still having a lot of cough but not sure if it's related to the BP medication.  Seeing cardiology appt in two weeks   Denies vaginal bleeding.  Patient's last menstrual period was 05/19/1990.          Sexually active: No.  H/O STD:  no  Health Maintenance: PCP:  Consuello Masse.  Last wellness appt was not done  Did blood work at that appt: had bloodwork done yesterday  Vaccines are up to date:  No, has not done pneumovax.  She will plan to do this later this year. Colonoscopy:  11-10-12 normal f/u 23yrs MMG:  12-16-2019 category d density birads 1:neg BMD:  03-30-13 osteopenia Last pap smear:  12-09-2018 neg H/o abnormal pap smear:  no   reports that she has never smoked. She has never used smokeless tobacco. She reports that she does not drink alcohol and does not use drugs.  Past Medical History:  Diagnosis Date  . Atypical moles   . GERD (gastroesophageal reflux disease)   . Melanoma ([place]) 1998  . Mixed incontinence   . Osteopenia     Past Surgical History:  Procedure Laterality Date  . APPENDECTOMY  1965  . BREAST BIOPSY Left 2011   fibrocystic changes  . CESAREAN SECTION    . excision of melanoma  1998   Dr Jon Gills  . TONSILLECTOMY  1955  . TUBAL LIGATION      Current Outpatient Medications  Medication Sig Dispense Refill  . b complex vitamins tablet Take 1 tablet by mouth daily.    Marland Kitchen estrogen, conjugated,-medroxyprogesterone (PREMPRO) 0.3-1.5 MG tablet Take 1 tablet by  mouth. Takes every 3rd day.    . lisinopril (ZESTRIL) 10 MG tablet Take 5 mg by mouth daily.    . Loratadine (CLARITIN PO) Take 1 capsule by mouth.    . metoprolol succinate (TOPROL-XL) 25 MG 24 hr tablet TAKE 1 TABLET BY MOUTH EVERY DAY 90 tablet 3  . omeprazole (PRILOSEC) 40 MG capsule Take 1 capsule by mouth 2 (two) times a week.    . Vitamin D, Ergocalciferol, (DRISDOL) 1.25 MG (50000 UNIT) CAPS capsule TAKE ONE CAPSULE BY MOUTH EVERY SEVEN DAYS 12 capsule 0   No current facility-administered medications for this visit.    Family History  Problem Relation Age of Onset  . Hypertension Mother   . Kidney disease Mother   . Transient ischemic attack Mother   . Lung cancer Father   . Lung cancer Brother   . Heart disease Maternal Uncle   . Melanoma Brother     Review of Systems  Constitutional: Negative.   HENT: Negative.   Eyes: Negative.   Respiratory: Negative.   Cardiovascular: Negative.   Gastrointestinal: Negative.   Endocrine: Negative.   Genitourinary: Negative.   Musculoskeletal: Negative.   Skin: Negative.   Allergic/Immunologic: Negative.   Neurological: Negative.   Hematological:  Negative.   Psychiatric/Behavioral: Negative.     Exam:   BP 122/78   Pulse 68   Resp 16   Ht 5' 2.75" (1.594 m)   Wt 130 lb (59 kg)   LMP 05/19/1990   BMI 23.21 kg/m   Height: 5' 2.75" (159.4 cm)  General appearance: alert, cooperative and appears stated age Breasts: normal appearance, no masses or tenderness Abdomen: soft, non-tender; bowel sounds normal; no masses,  no organomegaly Lymph nodes: Cervical, supraclavicular, and axillary nodes normal.  No abnormal inguinal nodes palpated Neurologic: Grossly normal  Pelvic: External genitalia:  no lesions              Urethra:  normal appearing urethra with no masses, tenderness or lesions              Bartholins and Skenes: normal                 Vagina: normal appearing vagina with normal color and discharge, no lesions               Cervix: no lesions              Pap taken: No. Bimanual Exam:  Uterus:  normal size, contour, position, consistency, mobility, non-tender              Adnexa: normal adnexa and no mass, fullness, tenderness               Rectovaginal: Confirms               Anus:  normal sphincter tone, no lesions  Chaperone, Royal Hawthorn, CMA, was present for exam.  A:  Breast and Pelvic exam PMP, on low dosed HRT Vit D deficiency Osteopenia Hypertension, h/o pre-melanoma Recent Covid infection  P:   Mammogram guidelines reviewed.  Doing yearly as is on HRT. pap smear neg 2020 RF for prem/pro 0.3/1.5mg  daily.  #30/12RF Vit D 50K weekly  #12/4RF Lab work just done yesterday. She will get the pneumonia vaccination later this year.   BMD has been ordered and she will do this when feeling better. return annually or prn  23 minutes of total time was spent for this patient encounter, including preparation, face-to-face counseling with the patient and coordination of care, and documentation of the encounter.

## 2020-02-15 ENCOUNTER — Other Ambulatory Visit (HOSPITAL_COMMUNITY)
Admission: RE | Admit: 2020-02-15 | Discharge: 2020-02-15 | Disposition: A | Discharge status: Home / Self Care | Payer: Medicare Other | Source: Ambulatory Visit | Attending: Family Medicine | Admitting: Family Medicine

## 2020-02-15 DIAGNOSIS — I10 Essential (primary) hypertension: Secondary | ICD-10-CM | POA: Diagnosis not present

## 2020-02-15 LAB — BASIC METABOLIC PANEL
Anion gap: 10 (ref 5–15)
BUN: 10 mg/dL (ref 8–23)
CO2: 25 mmol/L (ref 22–32)
Calcium: 9.2 mg/dL (ref 8.9–10.3)
Chloride: 103 mmol/L (ref 98–111)
Creatinine, Ser: 0.65 mg/dL (ref 0.44–1.00)
GFR calc Af Amer: >60 mL/min (ref >60)
GFR calc non Af Amer: >60 mL/min (ref >60)
Glucose, Bld: 98 mg/dL (ref 70–99)
Potassium: 3.6 mmol/L (ref 3.5–5.1)
Sodium: 138 mmol/L (ref 135–145)

## 2020-02-15 LAB — MAGNESIUM: Magnesium: 2.2 mg/dL (ref 1.7–2.4)

## 2020-02-16 ENCOUNTER — Other Ambulatory Visit: Payer: Self-pay

## 2020-02-16 ENCOUNTER — Encounter: Payer: Self-pay | Admitting: Obstetrics & Gynecology

## 2020-02-16 ENCOUNTER — Ambulatory Visit (INDEPENDENT_AMBULATORY_CARE_PROVIDER_SITE_OTHER): Payer: Medicare Other | Admitting: Obstetrics & Gynecology

## 2020-02-16 VITALS — BP 122/78 | HR 68 | Resp 16 | Ht 62.75 in | Wt 130.0 lb

## 2020-02-16 DIAGNOSIS — M8580 Other specified disorders of bone density and structure, unspecified site: Secondary | ICD-10-CM

## 2020-02-16 DIAGNOSIS — Z9229 Personal history of other drug therapy: Secondary | ICD-10-CM

## 2020-02-16 DIAGNOSIS — Z01419 Encounter for gynecological examination (general) (routine) without abnormal findings: Secondary | ICD-10-CM | POA: Diagnosis not present

## 2020-02-16 MED ORDER — CONJ ESTROG-MEDROXYPROGEST ACE 0.3-1.5 MG PO TABS
1.0000 | ORAL_TABLET | Freq: Every day | ORAL | 12 refills | Status: DC
Start: 2020-02-16 — End: 2021-03-08

## 2020-02-16 MED ORDER — VITAMIN D (ERGOCALCIFEROL) 1.25 MG (50000 UNIT) PO CAPS
50000.0000 [IU] | ORAL_CAPSULE | ORAL | 4 refills | Status: DC
Start: 2020-02-16 — End: 2021-03-08

## 2020-02-17 ENCOUNTER — Telehealth: Payer: Self-pay | Admitting: *Deleted

## 2020-02-17 NOTE — Telephone Encounter (Signed)
Pt aware.

## 2020-02-17 NOTE — Telephone Encounter (Signed)
-----   Message from Jamie Duke., NP sent at 02/16/2020  9:13 AM EDT ----- Please call the patient and tell her all of her labs look great.  Thank you

## 2020-02-29 NOTE — Progress Notes (Signed)
Cardiology Office Note  Date: 03/01/2020   ID: Maree, Ainley 12/10/1950, MRN [id number]  PCP:  Manon Hilding, MD  Cardiologist:  Carlyle Dolly, MD Electrophysiologist:  None   Chief Complaint: Follow-up palpitations  History of Present Illness: Jamie Duke is a 69 y.o. female with a history of palpitations, elevated blood pressure without diagnosis of hypertension.  Last encounter via telemedicine with Dr. Harl Bowie on 01/19/2019.  Patient had palpitations that have been ongoing for several years which were variable in frequency.  Could happen several times a day for several days in a row.  She could go weeks without symptoms.  Mainly occurs at night.  Felt as though her heart was racing.  Lasting less than a minute.  Had a cardiac monitor in January 2020 without any arrhythmias.  Her primary care provider started her on Toprol 25 mg.  Symptoms improved.  Blood pressure had been elevated in spite of being on a beta-blocker.  Norvasc was started at 5 mg daily.  She eventually stopped due to significant fatigue.  Blood pressures had recently been at goal.   At last visit she was here for 1 year follow-up.  She recently had Covid pneumonia in spite of having the vaccine.  Stated she was still trying to get over the pneumonia.  Still felt a little weak.  Blood pressure was elevated  on arrival.  Recheck in right arm was again 160/80.  Stated she had been checking her blood pressures at home.  She denied any anginal or exertional symptoms.  Palpitations occur very rarely.  Denied any orthostatic symptoms, PND, orthopnea, CVA/TIA-like symptoms, bleeding issues, claudication-like symptoms, DVT or PE-like symptoms, lower extremity edema.  She was taking amlodipine but did not tolerate and stopped.  At last visit we started Lisinopril 10 mg daily. She reported having h/a and being off balance. We reduced Lisinopril to 5 mg po daily.  She states today she is having more coughing as  a result of taking the lisinopril.  States she is feeling more fatigue and says some tingling in her feet which was not present before she started the medication.  Otherwise she denies any anginal or exertional symptoms, palpitations or arrhythmias, orthostatic symptoms, CVA or TIA-like symptoms PND, orthopnea, bleeding, claudication-like symptoms, DVT or PE-like symptoms, or lower extremity edema.  Past Medical History:  Diagnosis Date  . Atypical moles   . GERD (gastroesophageal reflux disease)   . Melanoma ([place]) 1998  . Mixed incontinence   . Osteopenia     Past Surgical History:  Procedure Laterality Date  . APPENDECTOMY  1965  . BREAST BIOPSY Left 2011   fibrocystic changes  . CESAREAN SECTION    . excision of melanoma  1998   Dr Jon Gills  . TONSILLECTOMY  1955  . TUBAL LIGATION      Current Outpatient Medications  Medication Sig Dispense Refill  . b complex vitamins tablet Take 1 tablet by mouth daily.    Marland Kitchen estrogen, conjugated,-medroxyprogesterone (PREMPRO) 0.3-1.5 MG tablet Take 1 tablet by mouth daily. 28 tablet 12  . lisinopril (ZESTRIL) 10 MG tablet Take 5 mg by mouth daily.    . Loratadine (CLARITIN PO) Take 1 capsule by mouth.    . metoprolol succinate (TOPROL-XL) 25 MG 24 hr tablet TAKE 1 TABLET BY MOUTH EVERY DAY 90 tablet 3  . omeprazole (PRILOSEC) 40 MG capsule Take 1 capsule by mouth 2 (two) times a week.    . Vitamin D, Ergocalciferol, (  DRISDOL) 1.25 MG (50000 UNIT) CAPS capsule Take 1 capsule (50,000 Units total) by mouth every 7 (seven) days. 12 capsule 4   No current facility-administered medications for this visit.   Allergies:  Amlodipine   Social History: The patient  reports that she has never smoked. She has never used smokeless tobacco. She reports that she does not drink alcohol and does not use drugs.   Family History: The patient's family history includes Heart disease in her maternal uncle; Hypertension in her mother; Kidney disease in her mother;  Lung cancer in her brother and father; Melanoma in her brother; Transient ischemic attack in her mother.   ROS:  Please see the history of present illness. Otherwise, complete review of systems is positive for none.  All other systems are reviewed and negative.   Physical Exam: VS:  BP (!) 162/82   Pulse 64   Ht 5\' 3"  (1.6 m)   Wt 132 lb 9.6 oz (60.1 kg)   LMP 05/19/1990   SpO2 98%   BMI 23.49 kg/m , BMI Body mass index is 23.49 kg/m.  Wt Readings from Last 3 Encounters:  03/01/20 132 lb 9.6 oz (60.1 kg)  02/16/20 130 lb (59 kg)  02/02/20 132 lb 3.2 oz (60 kg)    General: Patient appears comfortable at res  Neck: Supple, no elevated JVP or carotid bruits, no thyromegaly. Lungs: Clear to auscultation, nonlabored breathing at rest. Cardiac: Regular rate and rhythm, no S3 or significant systolic murmur, no pericardial rub. Extremities: No pitting edema, distal pulses 2+. Skin: Warm and dry. Musculoskeletal: No kyphosis. Neuropsychiatric: Alert and oriented x3, affect grossly appropriate.  ECG:    Recent Labwork: 02/15/2020: BUN 10; Creatinine, Ser 0.65; Magnesium 2.2; Potassium 3.6; Sodium 138     Component Value Date/Time   CHOL 196 12/22/2018 0822   TRIG 134 12/22/2018 0822   HDL 53 12/22/2018 0822   CHOLHDL 3.7 12/22/2018 0822   CHOLHDL 3.5 06/17/2016 0926   VLDL 29 06/17/2016 0926   LDLCALC 116 (H) 12/22/2018 0822   LDLDIRECT 128.0 08/23/2012 1527    Other Studies Reviewed Today:  Jan 2020 monitor  21 day event monitor  Min HR 61, Max HR 129, Avg HR 79  No symptoms reported  Telemetry shows normal sinus rhythm, no significant arrhythmias  Assessment and Plan:   1. Palpitations States she is having very rare palpitations .  Palpitations have significantly improved since taking Toprol-XL 25 mg daily.  Continue Toprol-XL 25 mg p.o. daily.  2. Essential hypertension Blood pressure is elevated today at 162/82 .  States her blood pressure was normal at her  OB/GYN office recently.  Stop lisinopril due to cough and fatigue.  Start hydralazine 10 mg p.o. 3 times daily.  Start checking her blood pressures daily.  Bring the log of blood pressures to nursing visit in 2 weeks.  We will follow-up in 1 month  Medication Adjustments/Labs and Tests Ordered: Current medicines are reviewed at length with the patient today.  Concerns regarding medicines are outlined above.   Disposition: Follow-up with Dr. Harl Bowie or APP 1 month. Signed, Levell July, NP 03/01/2020 8:18 AM    [facility] [place], [place], [place] [address] Phone: [phone]; Fax: [phone]

## 2020-03-01 ENCOUNTER — Encounter: Payer: Self-pay | Admitting: Family Medicine

## 2020-03-01 ENCOUNTER — Other Ambulatory Visit: Payer: Self-pay

## 2020-03-01 ENCOUNTER — Ambulatory Visit (INDEPENDENT_AMBULATORY_CARE_PROVIDER_SITE_OTHER): Payer: Medicare Other | Admitting: Family Medicine

## 2020-03-01 VITALS — BP 162/82 | HR 64 | Ht 63.0 in | Wt 132.6 lb

## 2020-03-01 DIAGNOSIS — R002 Palpitations: Secondary | ICD-10-CM | POA: Diagnosis not present

## 2020-03-01 DIAGNOSIS — I10 Essential (primary) hypertension: Secondary | ICD-10-CM | POA: Diagnosis not present

## 2020-03-01 MED ORDER — HYDRALAZINE HCL 10 MG PO TABS: 10.0000 mg | ORAL_TABLET | Freq: Three times a day (TID) | ORAL | 6 refills | Status: DC

## 2020-03-01 NOTE — Patient Instructions (Signed)
Medication Instructions:   Stop Lisinopril.   Begin Hydralazine 10mg  three times per day.  Continue all other medications.    Labwork: none  Testing/Procedures: none  Follow-Up: 1 month   Any Other Special Instructions Will Be Listed Below (If Applicable). 2 week nurse visit for blood pressure check   If you need a refill on your cardiac medications before your next appointment, please call your pharmacy.

## 2020-03-09 DIAGNOSIS — Z6822 Body mass index (BMI) 22.0-22.9, adult: Secondary | ICD-10-CM | POA: Diagnosis not present

## 2020-03-09 DIAGNOSIS — T148XXA Other injury of unspecified body region, initial encounter: Secondary | ICD-10-CM | POA: Diagnosis not present

## 2020-03-15 ENCOUNTER — Ambulatory Visit: Payer: Medicare Other | Admitting: *Deleted

## 2020-03-15 DIAGNOSIS — R030 Elevated blood-pressure reading, without diagnosis of hypertension: Secondary | ICD-10-CM | POA: Insufficient documentation

## 2020-03-15 MED ORDER — LOSARTAN POTASSIUM 25 MG PO TABS: 12.5000 mg | ORAL_TABLET | Freq: Every day | ORAL | 3 refills | Status: DC

## 2020-03-15 NOTE — Progress Notes (Signed)
Pt here for BP check after starting hydralazine 10 mg tid - says she is having trouble taking it consistently 3 times daily due to her work schedule and often misses lunch when at work and gets dizzy when she takes second dose - is requested an alternative that she could only have to take once daily - denies any complaints this morning - has been tired but has been since having COVID in August - brought in BP readings will give to provider for review - BP by nurse manual check 132/82 HR 65

## 2020-03-15 NOTE — Progress Notes (Signed)
We can try losartan 12.5 mg daily if she wants and stop the hydralazine.  We will need to get a BMP and magnesium in 2 weeks after she starts.  And have her come back in 2 weeks for nursing visit for blood pressure check after starting the medication.

## 2020-03-15 NOTE — Progress Notes (Signed)
Pt voiced understanding and will try losartan 12.5 daily - will have labs done at [facility] and will keep BP log and bring to 11/11 appt

## 2020-03-28 ENCOUNTER — Other Ambulatory Visit (HOSPITAL_COMMUNITY)
Admission: RE | Admit: 2020-03-28 | Discharge: 2020-03-28 | Disposition: A | Discharge status: Home / Self Care | Payer: Medicare Other | Source: Ambulatory Visit | Attending: Cardiology | Admitting: Cardiology

## 2020-03-28 DIAGNOSIS — R030 Elevated blood-pressure reading, without diagnosis of hypertension: Secondary | ICD-10-CM | POA: Insufficient documentation

## 2020-03-28 LAB — MAGNESIUM: Magnesium: 2.1 mg/dL (ref 1.7–2.4)

## 2020-03-28 LAB — BASIC METABOLIC PANEL
Anion gap: 8 (ref 5–15)
BUN: 10 mg/dL (ref 8–23)
CO2: 23 mmol/L (ref 22–32)
Calcium: 8.9 mg/dL (ref 8.9–10.3)
Chloride: 106 mmol/L (ref 98–111)
Creatinine, Ser: 0.62 mg/dL (ref 0.44–1.00)
GFR, Estimated: >60 mL/min (ref >60)
Glucose, Bld: 120 mg/dL — ABNORMAL HIGH (ref 70–99)
Potassium: 3.3 mmol/L — ABNORMAL LOW (ref 3.5–5.1)
Sodium: 137 mmol/L (ref 135–145)

## 2020-03-28 NOTE — Progress Notes (Addendum)
Cardiology Office Note  Date: 03/29/2020   ID: Robie, Oats [birth year], MRN [id number]  PCP:  Manon Hilding, MD  Cardiologist:  Carlyle Dolly, MD Electrophysiologist:  None   Chief Complaint: Follow-up palpitations  History of Present Illness: Jamie Duke is a 69 y.o. female with a history of palpitations, elevated blood pressure without diagnosis of hypertension.  Last encounter via telemedicine with Dr. Harl Bowie on 01/19/2019.  Patient had palpitations that have been ongoing for several years which were variable in frequency.  Could happen several times a day for several days in a row.  She could go weeks without symptoms.  Mainly occurs at night.  Felt as though her heart was racing.  Lasting less than a minute.  Had a cardiac monitor in January 2020 without any arrhythmias.  Her primary care provider started her on Toprol 25 mg.  Symptoms improved.  Blood pressure had been elevated in spite of being on a beta-blocker.  Norvasc was started at 5 mg daily.  She eventually stopped due to significant fatigue.  Blood pressures had recently been at goal.   At last visit she was here for 1 year follow-up.  She recently had Covid pneumonia in spite of having the vaccine.  Stated she was still trying to get over the pneumonia.  Still felt a little weak.  Blood pressure was elevated  on arrival.  Recheck in right arm was again 160/80.  Stated she had been checking her blood pressures at home.  She denied any anginal or exertional symptoms.  Palpitations occur very rarely.  Denied any orthostatic symptoms, PND, orthopnea, CVA/TIA-like symptoms, bleeding issues, claudication-like symptoms, DVT or PE-like symptoms, lower extremity edema.  She was taking amlodipine but did not tolerate and stopped.  At prior visit visit we started Lisinopril 10 mg daily. She reported having h/a and being off balance. We reduced Lisinopril to 5 mg po daily.  It was eventually stopped due to chronic  cough.  Today she has no put particular complaints.  She brings with her a log of blood pressures demonstrating improvement.  It appears systolic blood pressures ranging from a low of 124 to a high of 141 with an average 130.  Diastolic blood pressures ranging from a low of 74 to a high of 88.  She denies any recent anginal or exertional symptoms, palpitations or arrhythmias, orthostatic symptoms, lightheadedness, dizziness, presyncopal or syncopal episodes.  No lower extremity edema, PND, orthopnea, CVA or TIA-like symptoms,  Past Medical History:  Diagnosis Date  . Atypical moles   . GERD (gastroesophageal reflux disease)   . Melanoma ([place]) 1998  . Mixed incontinence   . Osteopenia     Past Surgical History:  Procedure Laterality Date  . APPENDECTOMY  1965  . BREAST BIOPSY Left 2011   fibrocystic changes  . CESAREAN SECTION    . excision of melanoma  1998   Dr Jon Gills  . TONSILLECTOMY  1955  . TUBAL LIGATION      Current Outpatient Medications  Medication Sig Dispense Refill  . b complex vitamins tablet Take 1 tablet by mouth daily.    Marland Kitchen estrogen, conjugated,-medroxyprogesterone (PREMPRO) 0.3-1.5 MG tablet Take 1 tablet by mouth daily. 28 tablet 12  . Loratadine (CLARITIN PO) Take 1 capsule by mouth.    . losartan (COZAAR) 25 MG tablet Take 0.5 tablets (12.5 mg total) by mouth daily. 15 tablet 3  . metoprolol succinate (TOPROL-XL) 25 MG 24 hr tablet TAKE 1 TABLET  BY MOUTH EVERY DAY 90 tablet 3  . omeprazole (PRILOSEC) 40 MG capsule Take 1 capsule by mouth 2 (two) times a week.    . Vitamin D, Ergocalciferol, (DRISDOL) 1.25 MG (50000 UNIT) CAPS capsule Take 1 capsule (50,000 Units total) by mouth every 7 (seven) days. 12 capsule 4   No current facility-administered medications for this visit.   Allergies:  Amlodipine   Social History: The patient  reports that she has never smoked. She has never used smokeless tobacco. She reports that she does not drink alcohol and does not use  drugs.   Family History: The patient's family history includes Heart disease in her maternal uncle; Hypertension in her mother; Kidney disease in her mother; Lung cancer in her brother and father; Melanoma in her brother; Transient ischemic attack in her mother.   ROS:  Please see the history of present illness. Otherwise, complete review of systems is positive for none.  All other systems are reviewed and negative.   Physical Exam: VS:  BP (!) 150/84   Pulse 66   Ht 5\' 3"  (1.6 m)   Wt 134 lb 12.8 oz (61.1 kg)   LMP 05/19/1990   SpO2 99%   BMI 23.88 kg/m , BMI Body mass index is 23.88 kg/m.  Wt Readings from Last 3 Encounters:  03/29/20 134 lb 12.8 oz (61.1 kg)  03/01/20 132 lb 9.6 oz (60.1 kg)  02/16/20 130 lb (59 kg)    General: Patient appears comfortable at res  Neck: Supple, no elevated JVP or carotid bruits, no thyromegaly. Lungs: Clear to auscultation, nonlabored breathing at rest. Cardiac: Regular rate and rhythm, no S3 or significant systolic murmur, no pericardial rub. Extremities: No pitting edema, distal pulses 2+. Skin: Warm and dry. Musculoskeletal: No kyphosis. Neuropsychiatric: Alert and oriented x3, affect grossly appropriate.  ECG: March 29, 2020.  Normal sinus rhythm rate of 81.  Recent Labwork: 03/28/2020: BUN 10; Creatinine, Ser 0.62; Magnesium 2.1; Potassium 3.3; Sodium 137     Component Value Date/Time   CHOL 196 12/22/2018 0822   TRIG 134 12/22/2018 0822   HDL 53 12/22/2018 0822   CHOLHDL 3.7 12/22/2018 0822   CHOLHDL 3.5 06/17/2016 0926   VLDL 29 06/17/2016 0926   LDLCALC 116 (H) 12/22/2018 0822   LDLDIRECT 128.0 08/23/2012 1527    Other Studies Reviewed Today:  Jan 2020 monitor  21 day event monitor  Min HR 61, Max HR 129, Avg HR 79  No symptoms reported  Telemetry shows normal sinus rhythm, no significant arrhythmias  Assessment and Plan:   1. Palpitations States she is having very rare palpitations .  Palpitations have  significantly improved since taking Toprol-XL 25 mg daily.  Continue Toprol-XL 25 mg p.o. daily.  2. Essential hypertension Blood pressure has improved since starting hydralazine.  She brings with her a log of blood pressures she is taking at home.  It appears to be running in the 846N systolic sometimes as low was 629 systolic.  Diastolic pressures running in the high 70s to low 80s.  Continue losartan 12.5 mg p.o. daily.  May need to titrate in the future depending on blood pressure averages.  Continue to check  blood pressures 2-3 times per week.  Goal continues to be at or below 130/80.  Call if blood pressures remain sustained above 130/80.  Recent lab work was unremarkable except for slightly low potassium at 3.3.  She was not fasting on that day and blood sugar was 120.  Medication Adjustments/Labs  and Tests Ordered: Current medicines are reviewed at length with the patient today.  Concerns regarding medicines are outlined above.   Disposition: Follow-up with Dr. Harl Bowie or APP 6 months  signed, Levell July, NP 03/29/2020 8:45 AM    [facility] [place], [place], [place] [address] Phone: [phone]; Fax: [phone]

## 2020-03-29 ENCOUNTER — Ambulatory Visit (INDEPENDENT_AMBULATORY_CARE_PROVIDER_SITE_OTHER): Payer: Medicare Other | Admitting: Family Medicine

## 2020-03-29 ENCOUNTER — Encounter: Payer: Self-pay | Admitting: Family Medicine

## 2020-03-29 VITALS — BP 150/84 | HR 66 | Ht 63.0 in | Wt 134.8 lb

## 2020-03-29 DIAGNOSIS — I10 Essential (primary) hypertension: Secondary | ICD-10-CM | POA: Diagnosis not present

## 2020-03-29 MED ORDER — LOSARTAN POTASSIUM 25 MG PO TABS: 12.5000 mg | ORAL_TABLET | Freq: Every day | ORAL | 3 refills | Status: DC

## 2020-03-29 NOTE — Patient Instructions (Signed)
Medication Instructions:    Your physician recommends that you continue on your current medications as directed. Please refer to the Current Medication list given to you today.  Labwork:  None  Testing/Procedures:  None  Follow-Up:  Your physician recommends that you schedule a follow-up appointment in: 6 months with Dr. Branch.  Any Other Special Instructions Will Be Listed Below (If Applicable).  If you need a refill on your cardiac medications before your next appointment, please call your pharmacy. 

## 2020-05-17 DIAGNOSIS — Z23 Encounter for immunization: Secondary | ICD-10-CM | POA: Diagnosis not present

## 2020-05-24 DIAGNOSIS — Z6822 Body mass index (BMI) 22.0-22.9, adult: Secondary | ICD-10-CM | POA: Diagnosis not present

## 2020-05-24 DIAGNOSIS — Z0000 Encounter for general adult medical examination without abnormal findings: Secondary | ICD-10-CM | POA: Diagnosis not present

## 2020-05-24 DIAGNOSIS — I10 Essential (primary) hypertension: Secondary | ICD-10-CM | POA: Diagnosis not present

## 2020-07-05 ENCOUNTER — Other Ambulatory Visit (INDEPENDENT_AMBULATORY_CARE_PROVIDER_SITE_OTHER): Payer: Self-pay

## 2020-07-05 MED ORDER — OMEPRAZOLE 40 MG PO CPDR: 40.0000 mg | DELAYED_RELEASE_CAPSULE | ORAL | 3 refills | Status: AC

## 2020-07-19 DIAGNOSIS — Z20828 Contact with and (suspected) exposure to other viral communicable diseases: Secondary | ICD-10-CM | POA: Diagnosis not present

## 2020-07-19 DIAGNOSIS — A084 Viral intestinal infection, unspecified: Secondary | ICD-10-CM | POA: Diagnosis not present

## 2020-07-19 DIAGNOSIS — R509 Fever, unspecified: Secondary | ICD-10-CM | POA: Diagnosis not present

## 2020-08-04 ENCOUNTER — Other Ambulatory Visit: Payer: Self-pay | Admitting: Cardiology

## 2020-08-28 DIAGNOSIS — H2513 Age-related nuclear cataract, bilateral: Secondary | ICD-10-CM | POA: Diagnosis not present

## 2020-08-28 DIAGNOSIS — H524 Presbyopia: Secondary | ICD-10-CM | POA: Diagnosis not present

## 2020-10-08 ENCOUNTER — Encounter: Payer: Self-pay | Admitting: Cardiology

## 2020-10-08 ENCOUNTER — Telehealth: Payer: Self-pay | Admitting: Cardiology

## 2020-10-08 ENCOUNTER — Ambulatory Visit (INDEPENDENT_AMBULATORY_CARE_PROVIDER_SITE_OTHER): Payer: Medicare Other | Admitting: Cardiology

## 2020-10-08 VITALS — BP 148/82 | HR 60 | Ht 64.0 in | Wt 133.2 lb

## 2020-10-08 DIAGNOSIS — R002 Palpitations: Secondary | ICD-10-CM

## 2020-10-08 DIAGNOSIS — I10 Essential (primary) hypertension: Secondary | ICD-10-CM | POA: Diagnosis not present

## 2020-10-08 MED ORDER — LOSARTAN POTASSIUM 25 MG PO TABS: 25.0000 mg | ORAL_TABLET | Freq: Every day | ORAL | 3 refills | Status: DC

## 2020-10-08 NOTE — Progress Notes (Signed)
Clinical Summary Ms. Tuccillo is a 70 y.o.female seen today for follow up of the following medical problems.     1. Palpitations Jan 2020 monitor without arrhythmias - no recent symptoms. Has done very well on toprol   2. 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 Atypical moles   . GERD (gastroesophageal reflux disease)   . Melanoma ([place]) 1998  . Mixed incontinence   . Osteopenia      Allergies  Allergen Reactions  . Amlodipine     FATIGUE      Current Outpatient Medications  Medication Sig Dispense Refill  . b complex vitamins tablet Take 1 tablet by mouth daily.    Marland Kitchen estrogen, conjugated,-medroxyprogesterone (PREMPRO) 0.3-1.5 MG tablet Take 1 tablet by mouth daily. 28 tablet 12  . Loratadine (CLARITIN PO) Take 1 capsule by mouth.    . losartan (COZAAR) 25 MG tablet Take 0.5 tablets (12.5 mg total) by mouth daily. 45 tablet 3  . metoprolol succinate (TOPROL-XL) 25 MG 24 hr tablet TAKE 1 TABLET BY MOUTH EVERY DAY 90 tablet 3  . omeprazole (PRILOSEC) 40 MG capsule Take 1 capsule (40 mg total) by mouth 2 (two) times a week. 30 capsule 3  . Vitamin D, Ergocalciferol, (DRISDOL) 1.25 MG (50000 UNIT) CAPS capsule Take 1 capsule (50,000 Units total) by mouth every 7 (seven) days. 12 capsule 4   No current facility-administered medications for this visit.     Past Surgical History:  Procedure Laterality Date  . APPENDECTOMY  1965  . BREAST BIOPSY Left 2011   fibrocystic changes  . CESAREAN SECTION    . excision of melanoma  1998   Dr Jon Gills  . TONSILLECTOMY  1955  . TUBAL LIGATION       Allergies  Allergen Reactions  . Amlodipine     FATIGUE       Family History  Problem Relation Age of Onset  . Hypertension Mother   . Kidney disease Mother   . Transient ischemic attack Mother   . Lung cancer Father   .  Lung cancer Brother   . Heart disease Maternal Uncle   . Melanoma Brother      Social History Ms. Kirsh reports that she has never smoked. She has never used smokeless tobacco. Ms. Sandquist reports no history of alcohol use.   Review of Systems CONSTITUTIONAL: No weight loss, fever, chills, weakness or fatigue.  HEENT: Eyes: No visual loss, blurred vision, double vision or yellow sclerae.No hearing loss, sneezing, congestion, runny nose or sore throat.  SKIN: No rash or itching.  CARDIOVASCULAR: per hpi RESPIRATORY: No shortness of breath, cough or sputum.  GASTROINTESTINAL: No anorexia, nausea, vomiting or diarrhea. No abdominal pain or blood.  GENITOURINARY: No burning on urination, no polyuria NEUROLOGICAL: No headache, dizziness, syncope, paralysis, ataxia, numbness or tingling in the extremities. No change in bowel or bladder control.  MUSCULOSKELETAL: No muscle, back pain, joint pain or stiffness.  LYMPHATICS: No enlarged nodes. No history of splenectomy.  PSYCHIATRIC: No history of depression or anxiety.  ENDOCRINOLOGIC: No reports of sweating, cold or heat intolerance. No polyuria or polydipsia.  Marland Kitchen   Physical Examination Today's Vitals   10/08/20 0815  BP: (!) 148/82  Pulse: 60  SpO2: 98%  Weight: 133 lb 3.2 oz (60.4 kg)  Height: 5\' 4"  (1.626  m)   Body mass index is 22.86 kg/m.  Gen: resting comfortably, no acute distress HEENT: no scleral icterus, pupils equal round and reactive, no palptable cervical adenopathy,  CV: RRR, no m/r/g, no jvd Resp: Clear to auscultation bilaterally GI: abdomen is soft, non-tender, non-distended, normal bowel sounds, no hepatosplenomegaly MSK: extremities are warm, no edema.  Skin: warm, no rash Neuro:  no focal deficits Psych: appropriate affect   Diagnostic Studies  Jan 2020 monitor  21 day event monitor  Min HR 61, Max HR 129, Avg HR 79  No symptoms reported  Telemetry shows normal sinus rhythm, no significant  arrhythmias    Assessment and Plan  1. Palpitations -benign event monitor - continue to do well on toprol, continue  2.HTN - side effects on norvasc and stopped taking - bp's elevated, increase losartan to 25mg  daily. She will call us with home bp's in 2 weeks   Request pcp labs F/u 1 year      Arnoldo Lenis, M.D

## 2020-10-08 NOTE — Patient Instructions (Addendum)
Medication Instructions:   Increase Losartan to 25mg  daily.   Continue all other current medications.  Labwork: none  Testing/Procedures: none  Follow-Up: Your physician wants you to follow up in:  1 year.  You will receive a reminder letter in the mail one-two months in advance.  If you don't receive a letter, please call our office to schedule the follow up appointment.    Any Other Special Instructions Will Be Listed Below (If Applicable).  Call the office in 2 weeks with update on blood pressure readings.    If you need a refill on your cardiac medications before your next appointment, please call your pharmacy.

## 2020-10-08 NOTE — Telephone Encounter (Signed)
Medication was sent to [facility].  Patient aware.

## 2020-10-08 NOTE — Telephone Encounter (Signed)
Patient called requesting that her prescriptions need to go to [facility] and not [facility].

## 2020-10-10 DIAGNOSIS — L2082 Flexural eczema: Secondary | ICD-10-CM | POA: Diagnosis not present

## 2020-10-10 DIAGNOSIS — L570 Actinic keratosis: Secondary | ICD-10-CM | POA: Diagnosis not present

## 2020-10-10 DIAGNOSIS — Z1283 Encounter for screening for malignant neoplasm of skin: Secondary | ICD-10-CM | POA: Diagnosis not present

## 2020-10-10 DIAGNOSIS — I781 Nevus, non-neoplastic: Secondary | ICD-10-CM | POA: Diagnosis not present

## 2020-10-25 ENCOUNTER — Telehealth: Payer: Self-pay | Admitting: Cardiology

## 2020-10-25 NOTE — Telephone Encounter (Signed)
Patient notified and verbalized understanding. 

## 2020-10-25 NOTE — Telephone Encounter (Signed)
Patient called stating that Dr.Branch wanted an update on her BP. States that he doubled her medication. Feels like her BP has impoved.

## 2020-10-25 NOTE — Telephone Encounter (Signed)
BP's running 120/72 - 130/85  HR - running in the 14's   Was told to call with update after increasing medication.

## 2020-11-20 ENCOUNTER — Telehealth: Payer: Self-pay | Admitting: Cardiology

## 2020-11-20 MED ORDER — LOSARTAN POTASSIUM 25 MG PO TABS: 25.0000 mg | ORAL_TABLET | Freq: Every day | ORAL | 3 refills | Status: DC

## 2020-11-20 NOTE — Telephone Encounter (Signed)
     1. Which medications need to be refilled? (please list name of each medication and dose if known) losartan (COZAAR) 25 MG tablet   2. Which pharmacy/location (including street and city if local pharmacy) is medication to be sent to? [facility]  3. Do they need a 30 day or 90 day supply? [place]

## 2020-11-20 NOTE — Telephone Encounter (Signed)
Medication refill for Losartan 25 mg tablets approved.

## 2020-11-21 ENCOUNTER — Telehealth: Payer: Self-pay | Admitting: Cardiology

## 2020-11-21 MED ORDER — LOSARTAN POTASSIUM 25 MG PO TABS: 25.0000 mg | ORAL_TABLET | Freq: Every day | ORAL | 3 refills | Status: DC

## 2020-11-21 NOTE — Telephone Encounter (Signed)
Resent to pharmacy per pt request. Dose was increased May 2022.

## 2020-11-21 NOTE — Telephone Encounter (Signed)
Patient called stating that she received a telephone call from her insurance company regarding medication Losartan 25 mg. The insurance company states that they need a new RX sent to [facility] with a new dosage so that insurance will pay for it. Patient states that she was taking 1/2 tab and now she is taking a whole tab.

## 2021-01-07 ENCOUNTER — Other Ambulatory Visit: Payer: Self-pay | Admitting: Obstetrics & Gynecology

## 2021-01-07 DIAGNOSIS — Z1231 Encounter for screening mammogram for malignant neoplasm of breast: Secondary | ICD-10-CM

## 2021-02-13 DIAGNOSIS — Z20828 Contact with and (suspected) exposure to other viral communicable diseases: Secondary | ICD-10-CM | POA: Diagnosis not present

## 2021-02-20 ENCOUNTER — Ambulatory Visit
Admission: RE | Admit: 2021-02-20 | Discharge: 2021-02-20 | Disposition: A | Discharge status: Home / Self Care | Payer: Medicare Other | Source: Ambulatory Visit | Attending: Obstetrics & Gynecology | Admitting: Obstetrics & Gynecology

## 2021-02-20 ENCOUNTER — Other Ambulatory Visit: Payer: Self-pay

## 2021-02-20 DIAGNOSIS — Z1231 Encounter for screening mammogram for malignant neoplasm of breast: Secondary | ICD-10-CM | POA: Diagnosis not present

## 2021-03-02 ENCOUNTER — Other Ambulatory Visit: Payer: Self-pay | Admitting: Obstetrics & Gynecology

## 2021-03-05 ENCOUNTER — Other Ambulatory Visit: Payer: Self-pay | Admitting: Obstetrics & Gynecology

## 2021-03-07 DIAGNOSIS — Z23 Encounter for immunization: Secondary | ICD-10-CM | POA: Diagnosis not present

## 2021-03-08 ENCOUNTER — Encounter (HOSPITAL_BASED_OUTPATIENT_CLINIC_OR_DEPARTMENT_OTHER): Payer: Self-pay | Admitting: Obstetrics & Gynecology

## 2021-03-08 ENCOUNTER — Ambulatory Visit (INDEPENDENT_AMBULATORY_CARE_PROVIDER_SITE_OTHER): Payer: Medicare Other | Admitting: Obstetrics & Gynecology

## 2021-03-08 ENCOUNTER — Other Ambulatory Visit: Payer: Self-pay

## 2021-03-08 VITALS — BP 177/97 | HR 64 | Ht 62.5 in | Wt 131.0 lb

## 2021-03-08 DIAGNOSIS — E559 Vitamin D deficiency, unspecified: Secondary | ICD-10-CM | POA: Diagnosis not present

## 2021-03-08 DIAGNOSIS — Z01419 Encounter for gynecological examination (general) (routine) without abnormal findings: Secondary | ICD-10-CM

## 2021-03-08 DIAGNOSIS — Z79890 Hormone replacement therapy (postmenopausal): Secondary | ICD-10-CM | POA: Diagnosis not present

## 2021-03-08 DIAGNOSIS — Z780 Asymptomatic menopausal state: Secondary | ICD-10-CM | POA: Diagnosis not present

## 2021-03-08 DIAGNOSIS — I10 Essential (primary) hypertension: Secondary | ICD-10-CM | POA: Diagnosis not present

## 2021-03-08 DIAGNOSIS — M8580 Other specified disorders of bone density and structure, unspecified site: Secondary | ICD-10-CM

## 2021-03-08 MED ORDER — VITAMIN D (ERGOCALCIFEROL) 1.25 MG (50000 UNIT) PO CAPS: 50000.0000 [IU] | ORAL_CAPSULE | ORAL | 4 refills | Status: DC

## 2021-03-08 MED ORDER — CONJ ESTROG-MEDROXYPROGEST ACE 0.3-1.5 MG PO TABS: 1.0000 | ORAL_TABLET | Freq: Every day | ORAL | 12 refills | Status: DC

## 2021-03-08 NOTE — Progress Notes (Signed)
70 y.o. G2P1 Married White or Caucasian female here for breast and pelvic exam. Doing well.  Denies vaginal bleeding.    Does desire to continue HRT.  Has some hot flashes but they are not severe.  Did try to decrease in the past and was miserable.  Is taking HRT every other day but if takes every 3rd day, she feels very uncomfortable with hot flashes.  Clearly aware of risks.  On prescription Vit D.  No issues.  Desires to continue.  Last level I did was normal.  Patient's last menstrual period was 05/19/1990.          Sexually active: No.  H/O STD:  no  Health Maintenance: PCP:  Dr. Quintin Alto.  Last wellness appt was 05/2020.  Did blood work at that appt:  yes Vaccines are up to date:  has not had the last booster.  Debating this.  Is going to get the pneumonia vaccinations Colonoscopy:  11/10/2012 MMG:  02/20/2021  Negative BMD:  03/30/2013.  Denies repeating right now. Last pap smear:  12/09/2018 Negative.   H/o abnormal pap smear:  no    reports that she has never smoked. She has never used smokeless tobacco. She reports that she does not drink alcohol and does not use drugs.  Past Medical History:  Diagnosis Date   Atypical moles    GERD (gastroesophageal reflux disease)    Melanoma ([place]) 1998   Mixed incontinence    Osteopenia     Past Surgical History:  Procedure Laterality Date   APPENDECTOMY  1965   BREAST BIOPSY Left 2011   fibrocystic changes   CESAREAN SECTION     excision of melanoma  1998   Dr Jon Gills   TONSILLECTOMY  1955   TUBAL LIGATION      Current Outpatient Medications  Medication Sig Dispense Refill   b complex vitamins tablet Take 1 tablet by mouth daily.     estrogen, conjugated,-medroxyprogesterone (PREMPRO) 0.3-1.5 MG tablet Take 1 tablet by mouth daily. 28 tablet 12   fexofenadine (ALLEGRA) 180 MG tablet Take 180 mg by mouth daily.     metoprolol succinate (TOPROL-XL) 25 MG 24 hr tablet TAKE 1 TABLET BY MOUTH EVERY DAY 90 tablet 3   omeprazole  (PRILOSEC) 40 MG capsule Take 1 capsule (40 mg total) by mouth 2 (two) times a week. 30 capsule 3   Vitamin D, Ergocalciferol, (DRISDOL) 1.25 MG (50000 UNIT) CAPS capsule Take 1 capsule (50,000 Units total) by mouth every 7 (seven) days. 12 capsule 4   losartan (COZAAR) 25 MG tablet Take 1 tablet (25 mg total) by mouth daily. 90 tablet 3   No current facility-administered medications for this visit.    Family History  Problem Relation Age of Onset   Hypertension Mother    Kidney disease Mother    Transient ischemic attack Mother    Lung cancer Father    Lung cancer Brother    Heart disease Maternal Uncle    Melanoma Brother     Review of Systems  All other systems reviewed and are negative.  Exam:   BP (!) 177/97 (BP Location: Right Arm, Patient Position: Sitting, Cuff Size: Normal)   Pulse 64   Ht 5' 2.5" (1.588 m)   Wt 131 lb (59.4 kg)   LMP 05/19/1990   BMI 23.58 kg/m   Height: 5' 2.5" (158.8 cm)  General appearance: alert, cooperative and appears stated age Breasts: normal appearance, no masses or tenderness Abdomen: soft, non-tender; bowel  sounds normal; no masses,  no organomegaly Lymph nodes: Cervical, supraclavicular, and axillary nodes normal.  No abnormal inguinal nodes palpated Neurologic: Grossly normal  Pelvic: External genitalia:  no lesions              Urethra:  normal appearing urethra with no masses, tenderness or lesions              Bartholins and Skenes: normal                 Vagina: normal appearing vagina with atrophic changes and no discharge, no lesions              Cervix: no lesions              Pap taken: No. Bimanual Exam:  Uterus:  normal size, contour, position, consistency, mobility, non-tender              Adnexa: normal adnexa and no mass, fullness, tenderness               Rectovaginal: Confirms               Anus:  normal sphincter tone, no lesions  Chaperone, Octaviano Batty, CMA, was present for exam.  Assessment/Plan: 1. Encntr  for gyn exam (general) (routine) w/o abn findings - pap 2020.  Not indicated today. - MMG 02/2021 - BMD 2014.  Declines having another one at this time as would not take medication for osteoporosis.  Discussed again today. - colonoscopy 10/2012 - lab work done with Dr. Quintin Alto - care gaps reviewed/updated  2. Postmenopausal  3. Primary hypertension - pt is going to start checking BPs at home.  Was worried about not finding new location today  4. Osteopenia, unspecified location  5. Hormone replacement therapy (HRT) - estrogen, conjugated,-medroxyprogesterone (PREMPRO) 0.3-1.5 MG tablet; Take 1 tablet by mouth daily.  Dispense: 28 tablet; Refill: 12  6. Vitamin D deficiency - will plan to check Vit D level next year - Vitamin D, Ergocalciferol, (DRISDOL) 1.25 MG (50000 UNIT) CAPS capsule; Take 1 capsule (50,000 Units total) by mouth every 7 (seven) days.  Dispense: 12 capsule; Refill: 4

## 2021-03-09 ENCOUNTER — Encounter (HOSPITAL_BASED_OUTPATIENT_CLINIC_OR_DEPARTMENT_OTHER): Payer: Self-pay | Admitting: Obstetrics & Gynecology

## 2021-03-20 DIAGNOSIS — Z23 Encounter for immunization: Secondary | ICD-10-CM | POA: Diagnosis not present

## 2021-05-14 DIAGNOSIS — Z20828 Contact with and (suspected) exposure to other viral communicable diseases: Secondary | ICD-10-CM | POA: Diagnosis not present

## 2021-06-05 DIAGNOSIS — I10 Essential (primary) hypertension: Secondary | ICD-10-CM | POA: Diagnosis not present

## 2021-06-05 DIAGNOSIS — Z0001 Encounter for general adult medical examination with abnormal findings: Secondary | ICD-10-CM | POA: Diagnosis not present

## 2021-06-05 DIAGNOSIS — R002 Palpitations: Secondary | ICD-10-CM | POA: Diagnosis not present

## 2021-06-05 DIAGNOSIS — Z6823 Body mass index (BMI) 23.0-23.9, adult: Secondary | ICD-10-CM | POA: Diagnosis not present

## 2021-06-28 DIAGNOSIS — Z20828 Contact with and (suspected) exposure to other viral communicable diseases: Secondary | ICD-10-CM | POA: Diagnosis not present

## 2021-08-02 ENCOUNTER — Other Ambulatory Visit: Payer: Self-pay | Admitting: Cardiology

## 2021-08-02 DIAGNOSIS — Z20828 Contact with and (suspected) exposure to other viral communicable diseases: Secondary | ICD-10-CM | POA: Diagnosis not present

## 2021-09-03 DIAGNOSIS — H2513 Age-related nuclear cataract, bilateral: Secondary | ICD-10-CM | POA: Diagnosis not present

## 2021-09-03 DIAGNOSIS — H524 Presbyopia: Secondary | ICD-10-CM | POA: Diagnosis not present

## 2021-09-18 IMAGING — MG DIGITAL SCREENING BILAT W/ TOMO W/ CAD
6 of 10 series · 6 of 30 positions shown · non-contrast
Comparison: Previous exam(s).

CLINICAL DATA: Screening.

EXAM:
DIGITAL SCREENING BILATERAL MAMMOGRAM WITH TOMO AND CAD

[L MLO synth-2D]
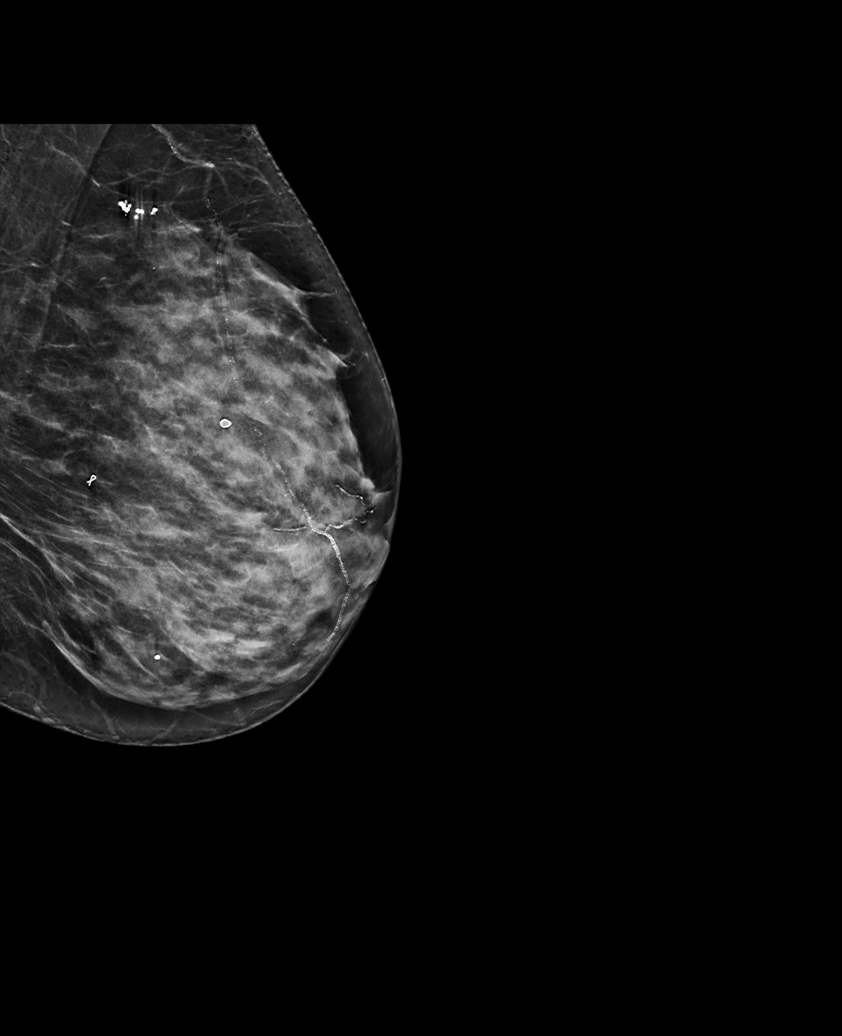

[R MLO synth-2D (1 of 2)]
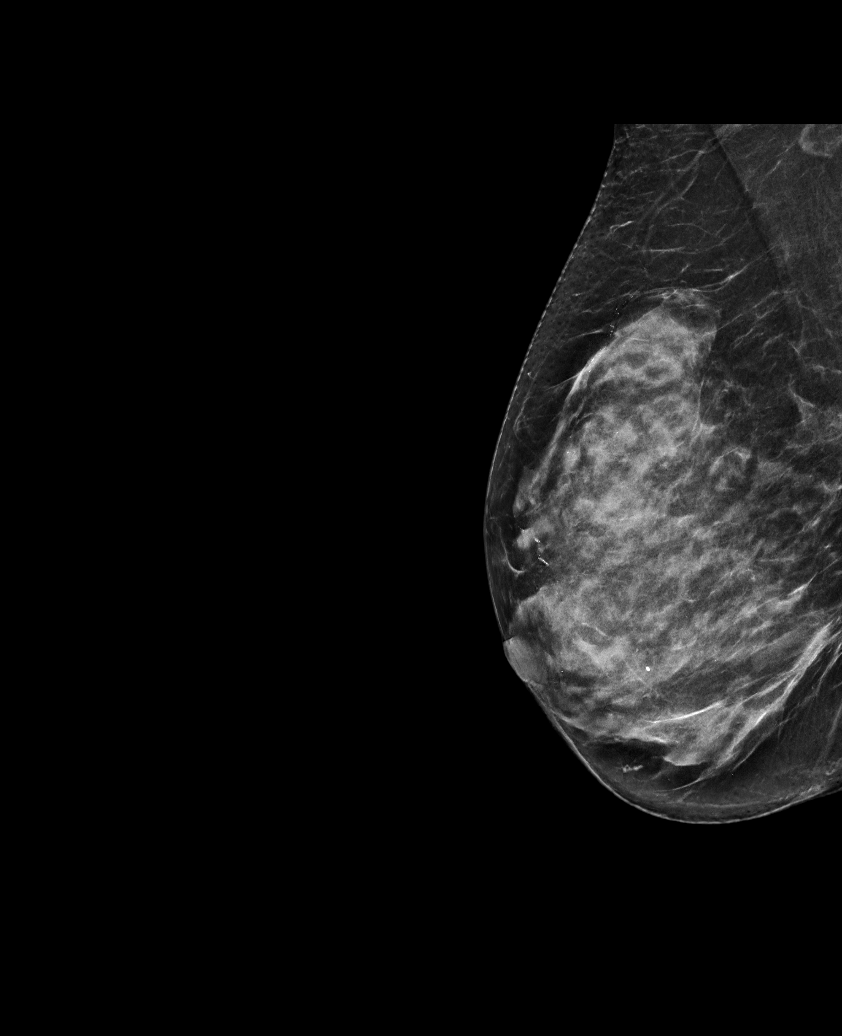

[L CC synth-2D]
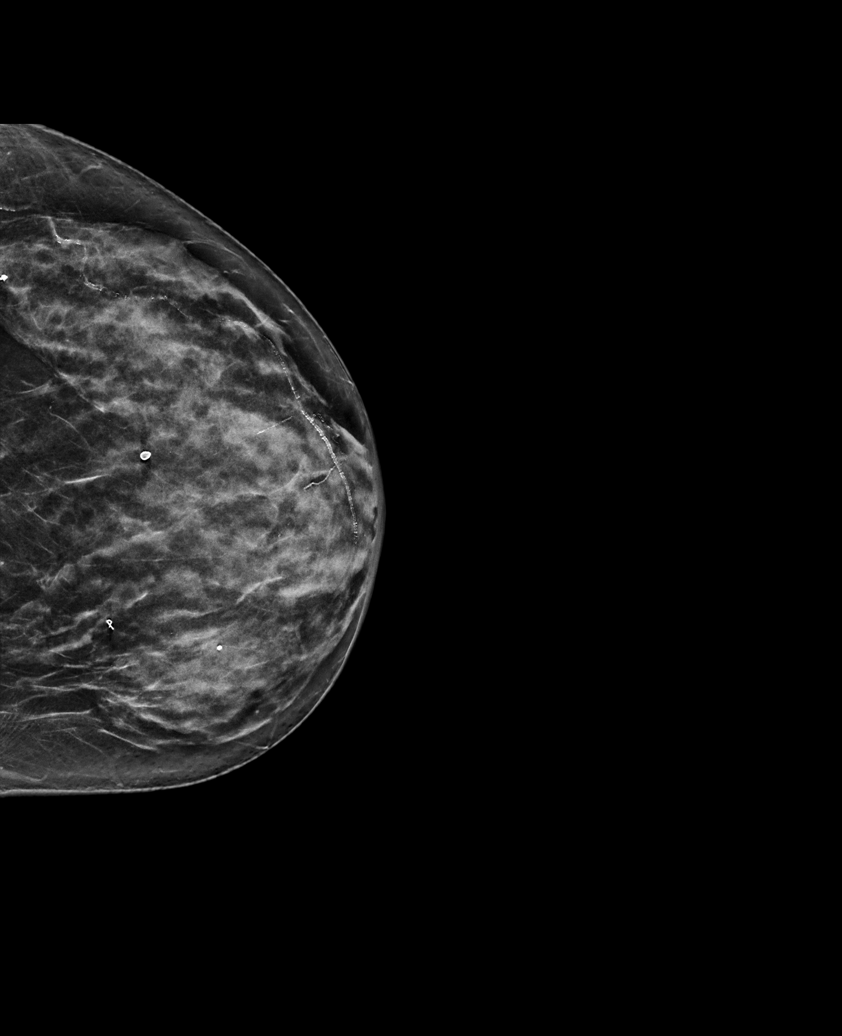

[R CC synth-2D]
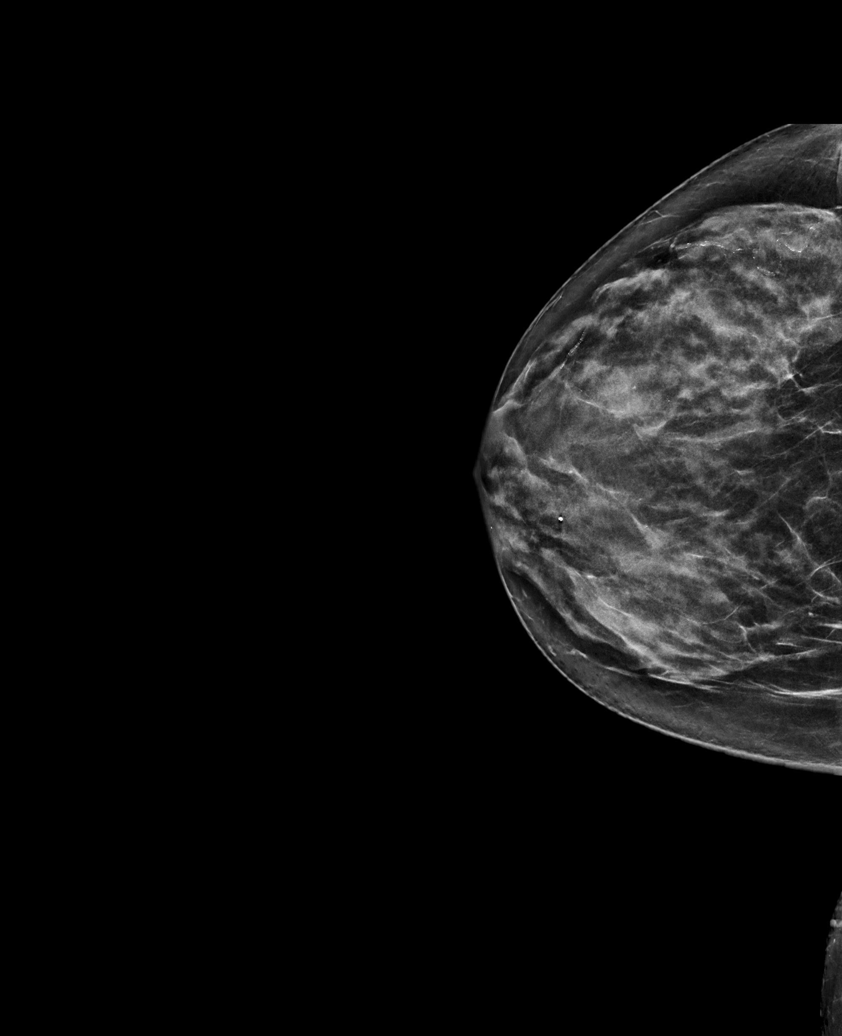

[R MLO synth-2D (2 of 2)]
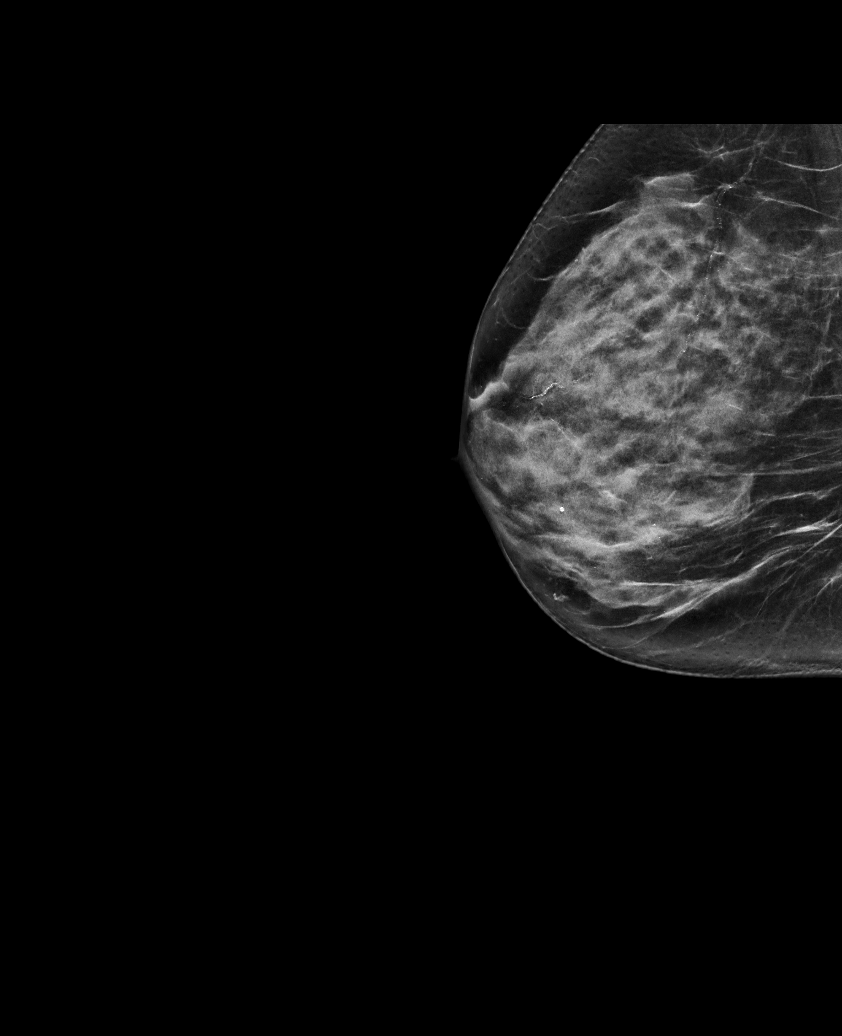

[L CC tomo · tomo slice 32/63.0]
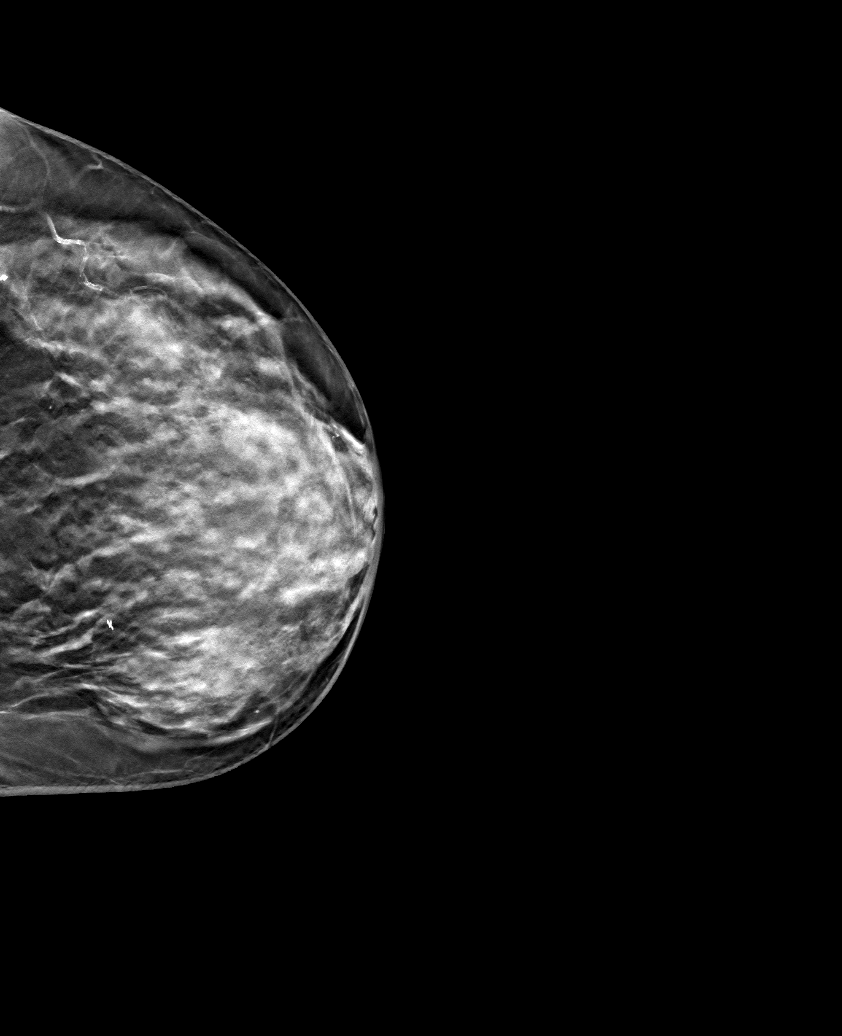

[6 of 30 positions shown; findings below may reference images not displayed]

ACR Breast Density Category d: The breast tissue is extremely dense,
which lowers the sensitivity of mammography
FINDINGS: There are no findings suspicious for malignancy. Images were
processed with CAD.
IMPRESSION: No mammographic evidence of malignancy. A result letter of this
screening mammogram will be mailed directly to the patient.

RECOMMENDATION:
Screening mammogram in one year. (Code:WO-0-ZI0)

BI-RADS CATEGORY  1: Negative.

## 2021-10-09 DIAGNOSIS — D239 Other benign neoplasm of skin, unspecified: Secondary | ICD-10-CM | POA: Diagnosis not present

## 2021-10-09 DIAGNOSIS — Z1283 Encounter for screening for malignant neoplasm of skin: Secondary | ICD-10-CM | POA: Diagnosis not present

## 2021-10-09 DIAGNOSIS — L570 Actinic keratosis: Secondary | ICD-10-CM | POA: Diagnosis not present

## 2022-01-28 ENCOUNTER — Other Ambulatory Visit: Payer: Self-pay | Admitting: Obstetrics & Gynecology

## 2022-01-28 DIAGNOSIS — Z1231 Encounter for screening mammogram for malignant neoplasm of breast: Secondary | ICD-10-CM

## 2022-02-07 ENCOUNTER — Other Ambulatory Visit: Payer: Self-pay | Admitting: Cardiology

## 2022-02-26 ENCOUNTER — Ambulatory Visit
Admission: RE | Admit: 2022-02-26 | Discharge: 2022-02-26 | Disposition: A | Discharge status: Home / Self Care | Payer: Medicare Other | Source: Ambulatory Visit | Attending: Obstetrics & Gynecology | Admitting: Obstetrics & Gynecology

## 2022-02-26 DIAGNOSIS — Z1231 Encounter for screening mammogram for malignant neoplasm of breast: Secondary | ICD-10-CM | POA: Diagnosis not present

## 2022-03-05 DIAGNOSIS — Z23 Encounter for immunization: Secondary | ICD-10-CM | POA: Diagnosis not present

## 2022-04-03 ENCOUNTER — Ambulatory Visit (INDEPENDENT_AMBULATORY_CARE_PROVIDER_SITE_OTHER): Payer: Medicare Other | Admitting: Obstetrics & Gynecology

## 2022-04-03 ENCOUNTER — Encounter (HOSPITAL_BASED_OUTPATIENT_CLINIC_OR_DEPARTMENT_OTHER): Payer: Self-pay | Admitting: Obstetrics & Gynecology

## 2022-04-03 VITALS — BP 174/78 | HR 59 | Ht 63.0 in | Wt 128.8 lb

## 2022-04-03 DIAGNOSIS — Z79890 Hormone replacement therapy (postmenopausal): Secondary | ICD-10-CM

## 2022-04-03 DIAGNOSIS — E559 Vitamin D deficiency, unspecified: Secondary | ICD-10-CM

## 2022-04-03 DIAGNOSIS — M8580 Other specified disorders of bone density and structure, unspecified site: Secondary | ICD-10-CM

## 2022-04-03 DIAGNOSIS — M8588 Other specified disorders of bone density and structure, other site: Secondary | ICD-10-CM | POA: Diagnosis not present

## 2022-04-03 MED ORDER — VITAMIN D (ERGOCALCIFEROL) 1.25 MG (50000 UNIT) PO CAPS: 50000.0000 [IU] | ORAL_CAPSULE | ORAL | 4 refills | Status: DC

## 2022-04-03 MED ORDER — CONJ ESTROG-MEDROXYPROGEST ACE 0.3-1.5 MG PO TABS: 1.0000 | ORAL_TABLET | Freq: Every day | ORAL | 12 refills | Status: DC

## 2022-04-03 NOTE — Progress Notes (Signed)
GYNECOLOGY  VISIT  CC:   medication check  HPI: 71 y.o. G2P1 Married White or Caucasian female here for medication discussion.  Doing well.  No VB.  MMG is up to date and was done 02/26/2022.   On prem/pro lowest dosage and takes every other day.  Has tried to stop HRT and has significant hot flashes when off.  Aware of risks including breast cancer, stroke, DVT and PE.  Feels quality of life is worth these risks.  Needs BMD ordered this year.  Also on Vit D and needs refill.  Has no issues with this medication.   Past Medical History:  Diagnosis Date   Atypical moles    GERD (gastroesophageal reflux disease)    Hypertension    Melanoma ([place]) 1998   Mixed incontinence    Osteopenia     MEDS:   Current Outpatient Medications on File Prior to Visit  Medication Sig Dispense Refill   b complex vitamins tablet Take 1 tablet by mouth daily.     estrogen, conjugated,-medroxyprogesterone (PREMPRO) 0.3-1.5 MG tablet Take 1 tablet by mouth daily. 28 tablet 12   fexofenadine (ALLEGRA) 180 MG tablet Take 180 mg by mouth daily.     losartan (COZAAR) 25 MG tablet TAKE 1 TABLET BY MOUTH DAILY 90 tablet 1   metoprolol succinate (TOPROL-XL) 25 MG 24 hr tablet TAKE 1 TABLET BY MOUTH EVERY DAY 90 tablet 3   omeprazole (PRILOSEC) 40 MG capsule Take 1 capsule (40 mg total) by mouth 2 (two) times a week. 30 capsule 3   Vitamin D, Ergocalciferol, (DRISDOL) 1.25 MG (50000 UNIT) CAPS capsule Take 1 capsule (50,000 Units total) by mouth every 7 (seven) days. 12 capsule 4   No current facility-administered medications on file prior to visit.    ALLERGIES: Amlodipine  SH:  married, non smoker  Review of Systems  Constitutional: Negative.   Genitourinary: Negative.     PHYSICAL EXAMINATION:    BP (!) 174/78 (BP Location: Left Arm, Patient Position: Sitting, Cuff Size: Large)   Pulse (!) 59   Ht '5\' 3"'$  (1.6 m) Comment: Reported  Wt 128 lb 12.8 oz (58.4 kg)   LMP 05/19/1990   BMI 22.82 kg/m      General appearance: alert, cooperative and appears stated age CV:  Regular rate and rhythm Lungs:  clear to auscultation, no wheezes, rales or rhonchi, symmetric air entry  Assessment/Plan: 1. Osteopenia, unspecified location - DG BONE DENSITY (DXA); Future  2. Other specified disorders of bone density and structure, other site  3. Vitamin D deficiency - Vitamin D, Ergocalciferol, (DRISDOL) 1.25 MG (50000 UNIT) CAPS capsule; Take 1 capsule (50,000 Units total) by mouth every 7 (seven) days.  Dispense: 12 capsule; Refill: 4  4. Hormone replacement therapy (HRT) - estrogen, conjugated,-medroxyprogesterone (PREMPRO) 0.3-1.5 MG tablet; Take 1 tablet by mouth daily.  Dispense: 28 tablet; Refill: 12

## 2022-04-06 DIAGNOSIS — M8588 Other specified disorders of bone density and structure, other site: Secondary | ICD-10-CM | POA: Insufficient documentation

## 2022-04-06 DIAGNOSIS — E559 Vitamin D deficiency, unspecified: Secondary | ICD-10-CM | POA: Insufficient documentation

## 2022-04-06 DIAGNOSIS — Z79890 Hormone replacement therapy (postmenopausal): Secondary | ICD-10-CM | POA: Insufficient documentation

## 2022-06-11 DIAGNOSIS — Z0001 Encounter for general adult medical examination with abnormal findings: Secondary | ICD-10-CM | POA: Diagnosis not present

## 2022-06-11 DIAGNOSIS — Z13220 Encounter for screening for lipoid disorders: Secondary | ICD-10-CM | POA: Diagnosis not present

## 2022-06-11 DIAGNOSIS — F17210 Nicotine dependence, cigarettes, uncomplicated: Secondary | ICD-10-CM | POA: Diagnosis not present

## 2022-06-11 DIAGNOSIS — Z6823 Body mass index (BMI) 23.0-23.9, adult: Secondary | ICD-10-CM | POA: Diagnosis not present

## 2022-06-11 DIAGNOSIS — I10 Essential (primary) hypertension: Secondary | ICD-10-CM | POA: Diagnosis not present

## 2022-06-11 DIAGNOSIS — R002 Palpitations: Secondary | ICD-10-CM | POA: Diagnosis not present

## 2022-06-18 DIAGNOSIS — J309 Allergic rhinitis, unspecified: Secondary | ICD-10-CM | POA: Diagnosis not present

## 2022-06-18 DIAGNOSIS — I10 Essential (primary) hypertension: Secondary | ICD-10-CM | POA: Diagnosis not present

## 2022-06-18 DIAGNOSIS — Z6822 Body mass index (BMI) 22.0-22.9, adult: Secondary | ICD-10-CM | POA: Diagnosis not present

## 2022-06-18 DIAGNOSIS — M79605 Pain in left leg: Secondary | ICD-10-CM | POA: Diagnosis not present

## 2022-06-18 DIAGNOSIS — R002 Palpitations: Secondary | ICD-10-CM | POA: Diagnosis not present

## 2022-06-18 DIAGNOSIS — E7849 Other hyperlipidemia: Secondary | ICD-10-CM | POA: Diagnosis not present

## 2022-06-20 ENCOUNTER — Encounter: Payer: Self-pay | Admitting: Cardiology

## 2022-06-20 ENCOUNTER — Ambulatory Visit: Payer: Medicare Other | Attending: Cardiology | Admitting: Cardiology

## 2022-06-20 VITALS — BP 145/78 | HR 66 | Ht 64.0 in | Wt 128.6 lb

## 2022-06-20 DIAGNOSIS — R002 Palpitations: Secondary | ICD-10-CM

## 2022-06-20 DIAGNOSIS — I10 Essential (primary) hypertension: Secondary | ICD-10-CM | POA: Diagnosis not present

## 2022-06-20 DIAGNOSIS — Z79899 Other long term (current) drug therapy: Secondary | ICD-10-CM | POA: Diagnosis not present

## 2022-06-20 MED ORDER — LOSARTAN POTASSIUM 50 MG PO TABS: 50.0000 mg | ORAL_TABLET | Freq: Every day | ORAL | 6 refills | Status: DC

## 2022-06-20 NOTE — Addendum Note (Signed)
Addended by: Laurine Blazer on: 06/20/2022 09:21 AM   Modules accepted: Orders

## 2022-06-20 NOTE — Progress Notes (Signed)
Clinical Summary Jamie Duke is a 72 y.o.female seen today for follow up of the following medical problems.       1. Palpitations Jan 2020 monitor without arrhythmias - no recent symptoms. Compliant with toprol.      2. 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     estrogen, conjugated,-medroxyprogesterone (PREMPRO) 0.3-1.5 MG tablet Take 1 tablet by mouth daily. 28 tablet 12   fexofenadine (ALLEGRA) 180 MG tablet Take 180 mg by mouth daily.     losartan (COZAAR) 25 MG tablet TAKE 1 TABLET BY MOUTH DAILY 90 tablet 1   metoprolol succinate (TOPROL-XL) 25 MG 24 hr tablet TAKE 1 TABLET BY MOUTH EVERY DAY 90 tablet 3   omeprazole (PRILOSEC) 40 MG capsule Take 1 capsule (40 mg total) by mouth 2 (two) times a week. 30 capsule 3   Vitamin D, Ergocalciferol, (DRISDOL) 1.25 MG (50000 UNIT) CAPS capsule Take 1 capsule (50,000 Units total) by mouth every 7 (seven) days. 12 capsule 4   No current facility-administered medications for this visit.     Past Surgical History:  Procedure Laterality Date   APPENDECTOMY  1965   BREAST BIOPSY Left 2011   fibrocystic changes   CESAREAN SECTION     excision of melanoma  1998   Jamie Duke   TONSILLECTOMY  1955   TUBAL LIGATION       Allergies  Allergen Reactions   Amlodipine     FATIGUE       Family History  Problem Relation Age of Onset   Hypertension Mother    Kidney disease Mother    Transient ischemic attack Mother    Lung cancer Father    Lung cancer Brother     Heart disease Maternal Uncle    Melanoma Brother      Social History Jamie Duke reports that she has never smoked. She has never used smokeless tobacco. Jamie Duke reports no history of alcohol use.   Review of Systems CONSTITUTIONAL: No weight loss, fever, chills, weakness or fatigue.  HEENT: Eyes: No visual loss, blurred vision, double vision or yellow sclerae.No hearing loss, sneezing, congestion, runny nose or sore throat.  SKIN: No rash or itching.  CARDIOVASCULAR: per hpi RESPIRATORY: No shortness of breath, cough or sputum.  GASTROINTESTINAL: No anorexia, nausea, vomiting or diarrhea. No abdominal pain or blood.  GENITOURINARY: No burning on urination, no polyuria NEUROLOGICAL: No headache, dizziness, syncope, paralysis, ataxia, numbness or tingling in the extremities. No change in bowel or bladder control.  MUSCULOSKELETAL: No muscle, back pain, joint pain or stiffness.  LYMPHATICS: No enlarged nodes. No history of splenectomy.  PSYCHIATRIC: No history of depression or anxiety.  ENDOCRINOLOGIC: No reports of sweating, cold or heat intolerance. No polyuria or polydipsia.  Marland Kitchen   Physical Examination Today's Vitals   06/20/22 0839 06/20/22 0850 06/20/22 0905  BP: (!) 146/88 (!) 148/86 (!) 145/78  Pulse: 66  SpO2: 96%    Weight: 128 lb 9.6 oz (58.3 kg)    Height: '5\' 4"'$  (1.626 m)     Body mass index is 22.07 kg/m.  Gen: resting comfortably, no acute distress HEENT: no scleral icterus, pupils equal round and reactive, no palptable cervical adenopathy,  CV: RRR, no m/r/g no jvd Resp: Clear to auscultation bilaterally GI: abdomen is soft, non-tender, non-distended, normal bowel sounds, no hepatosplenomegaly MSK: extremities are warm, no edema.  Skin: warm, no rash Neuro:  no focal deficits Psych: appropriate affect   Diagnostic Studies Jan 2020 monitor 21 day event monitor Min HR 61, Max HR 129, Avg HR 79 No symptoms reported Telemetry shows normal sinus  rhythm, no significant arrhythmias    Assessment and Plan   1. Palpitations - benign event monitor - no recent symptoms, continue toprol - EKG today shows NSR   2. HTN - side effects on norvasc and stopped taking - bp above goal, increase losartan to '50mg'$  daily with bmet 2 weeks   F/u 1 year     Arnoldo Lenis, M.D.

## 2022-06-20 NOTE — Patient Instructions (Signed)
Medication Instructions:  Increase Losartan to '50mg'$  daily  Continue all other medications.     Labwork: BMET - order given today  Please do in 2 weeks  Office will contact with results via phone, letter or mychart.     Testing/Procedures: none  Follow-Up: Your physician wants you to follow up in:  1 year.  You should receive a call from the office when due.  If you don't receive this call, please call our office to schedule the follow up appointment    Any Other Special Instructions Will Be Listed Below (If Applicable).   If you need a refill on your cardiac medications before your next appointment, please call your pharmacy.

## 2022-07-04 ENCOUNTER — Other Ambulatory Visit (HOSPITAL_COMMUNITY)
Admission: RE | Admit: 2022-07-04 | Discharge: 2022-07-04 | Disposition: A | Discharge status: Home / Self Care | Payer: Medicare Other | Source: Ambulatory Visit | Attending: Cardiology | Admitting: Cardiology

## 2022-07-04 ENCOUNTER — Telehealth: Payer: Self-pay

## 2022-07-04 DIAGNOSIS — Z79899 Other long term (current) drug therapy: Secondary | ICD-10-CM | POA: Diagnosis not present

## 2022-07-04 DIAGNOSIS — I10 Essential (primary) hypertension: Secondary | ICD-10-CM | POA: Insufficient documentation

## 2022-07-04 LAB — BASIC METABOLIC PANEL
Anion gap: 9 (ref 5–15)
BUN: 13 mg/dL (ref 8–23)
CO2: 25 mmol/L (ref 22–32)
Calcium: 8.8 mg/dL — ABNORMAL LOW (ref 8.9–10.3)
Chloride: 106 mmol/L (ref 98–111)
Creatinine, Ser: 0.68 mg/dL (ref 0.44–1.00)
GFR, Estimated: >60 mL/min (ref >60)
Glucose, Bld: 106 mg/dL — ABNORMAL HIGH (ref 70–99)
Potassium: 3.4 mmol/L — ABNORMAL LOW (ref 3.5–5.1)
Sodium: 140 mmol/L (ref 135–145)

## 2022-07-04 MED ORDER — POTASSIUM CHLORIDE CRYS ER 20 MEQ PO TBCR: EXTENDED_RELEASE_TABLET | ORAL | 0 refills | Status: DC

## 2022-07-04 NOTE — Telephone Encounter (Signed)
Patient notified and verbalized understanding. Patient had no questions or concerns at this time. PCP copied 

## 2022-07-04 NOTE — Telephone Encounter (Signed)
-----   Message from Arnoldo Lenis, MD sent at 07/04/2022 12:29 PM EST ----- Labs show potassium a little low. Could she take potassium chloride 61mq daily x 3 days  JZandra AbtsMD

## 2022-07-08 ENCOUNTER — Telehealth: Payer: Self-pay | Admitting: Cardiology

## 2022-07-08 NOTE — Telephone Encounter (Signed)
Informed patient that her Potassium level was 3.4.  Normal range is 3.5-5.1.   She verbalized understanding.

## 2022-07-08 NOTE — Telephone Encounter (Signed)
Pt is calling to get the potassium results. She states she did not write down the potassium level when Caryl Pina called her on 2/16.

## 2022-07-26 ENCOUNTER — Other Ambulatory Visit: Payer: Self-pay | Admitting: Cardiology

## 2022-07-28 ENCOUNTER — Other Ambulatory Visit: Payer: Self-pay

## 2022-07-28 MED ORDER — LOSARTAN POTASSIUM 50 MG PO TABS: 50.0000 mg | ORAL_TABLET | Freq: Every day | ORAL | 1 refills | Status: DC

## 2022-09-09 DIAGNOSIS — H5203 Hypermetropia, bilateral: Secondary | ICD-10-CM | POA: Diagnosis not present

## 2022-09-09 DIAGNOSIS — H2513 Age-related nuclear cataract, bilateral: Secondary | ICD-10-CM | POA: Diagnosis not present

## 2022-10-01 ENCOUNTER — Ambulatory Visit
Admission: RE | Admit: 2022-10-01 | Discharge: 2022-10-01 | Disposition: A | Discharge status: Home / Self Care | Payer: Medicare Other | Source: Ambulatory Visit | Attending: Obstetrics & Gynecology | Admitting: Obstetrics & Gynecology

## 2022-10-01 DIAGNOSIS — M8580 Other specified disorders of bone density and structure, unspecified site: Secondary | ICD-10-CM

## 2022-10-01 DIAGNOSIS — M8588 Other specified disorders of bone density and structure, other site: Secondary | ICD-10-CM

## 2022-10-01 DIAGNOSIS — N951 Menopausal and female climacteric states: Secondary | ICD-10-CM | POA: Diagnosis not present

## 2022-10-01 DIAGNOSIS — E2839 Other primary ovarian failure: Secondary | ICD-10-CM | POA: Diagnosis not present

## 2022-10-01 DIAGNOSIS — E559 Vitamin D deficiency, unspecified: Secondary | ICD-10-CM | POA: Diagnosis not present

## 2022-10-08 DIAGNOSIS — L2082 Flexural eczema: Secondary | ICD-10-CM | POA: Diagnosis not present

## 2022-10-08 DIAGNOSIS — Z1283 Encounter for screening for malignant neoplasm of skin: Secondary | ICD-10-CM | POA: Diagnosis not present

## 2022-10-08 DIAGNOSIS — D239 Other benign neoplasm of skin, unspecified: Secondary | ICD-10-CM | POA: Diagnosis not present

## 2022-10-08 DIAGNOSIS — L570 Actinic keratosis: Secondary | ICD-10-CM | POA: Diagnosis not present

## 2023-01-22 ENCOUNTER — Other Ambulatory Visit: Payer: Self-pay | Admitting: Cardiology

## 2023-02-16 ENCOUNTER — Other Ambulatory Visit: Payer: Self-pay | Admitting: Obstetrics & Gynecology

## 2023-02-16 DIAGNOSIS — Z1231 Encounter for screening mammogram for malignant neoplasm of breast: Secondary | ICD-10-CM

## 2023-03-18 ENCOUNTER — Ambulatory Visit
Admission: RE | Admit: 2023-03-18 | Discharge: 2023-03-18 | Disposition: A | Discharge status: Home / Self Care | Payer: Medicare Other | Source: Ambulatory Visit | Attending: Obstetrics & Gynecology | Admitting: Obstetrics & Gynecology

## 2023-03-18 DIAGNOSIS — Z1231 Encounter for screening mammogram for malignant neoplasm of breast: Secondary | ICD-10-CM

## 2023-03-27 DIAGNOSIS — Z23 Encounter for immunization: Secondary | ICD-10-CM | POA: Diagnosis not present

## 2023-04-21 ENCOUNTER — Ambulatory Visit (HOSPITAL_BASED_OUTPATIENT_CLINIC_OR_DEPARTMENT_OTHER): Payer: Medicare Other | Admitting: Obstetrics & Gynecology

## 2023-04-21 ENCOUNTER — Encounter (HOSPITAL_BASED_OUTPATIENT_CLINIC_OR_DEPARTMENT_OTHER): Payer: Self-pay | Admitting: Obstetrics & Gynecology

## 2023-04-21 ENCOUNTER — Telehealth: Payer: Self-pay | Admitting: Cardiology

## 2023-04-21 VITALS — BP 190/92 | HR 64 | Ht 62.0 in | Wt 127.4 lb

## 2023-04-21 DIAGNOSIS — E559 Vitamin D deficiency, unspecified: Secondary | ICD-10-CM

## 2023-04-21 DIAGNOSIS — I868 Varicose veins of other specified sites: Secondary | ICD-10-CM

## 2023-04-21 DIAGNOSIS — Z1211 Encounter for screening for malignant neoplasm of colon: Secondary | ICD-10-CM | POA: Diagnosis not present

## 2023-04-21 DIAGNOSIS — Z79890 Hormone replacement therapy (postmenopausal): Secondary | ICD-10-CM

## 2023-04-21 DIAGNOSIS — Z01419 Encounter for gynecological examination (general) (routine) without abnormal findings: Secondary | ICD-10-CM

## 2023-04-21 MED ORDER — CONJ ESTROG-MEDROXYPROGEST ACE 0.3-1.5 MG PO TABS: 1.0000 | ORAL_TABLET | Freq: Every day | ORAL | 12 refills | Status: AC

## 2023-04-21 MED ORDER — VITAMIN D (ERGOCALCIFEROL) 1.25 MG (50000 UNIT) PO CAPS: 50000.0000 [IU] | ORAL_CAPSULE | ORAL | 4 refills | Status: DC

## 2023-04-21 NOTE — Telephone Encounter (Signed)
Pt c/o BP issue: STAT if pt c/o blurred vision, one-sided weakness or slurred speech  1. What are your last 5 BP readings? 193/90  2. Are you having any other symptoms (ex. Dizziness, headache, blurred vision, passed out)? No symptoms  3. What is your BP issue?  [facility] is calling for patient stating they have her in office with high BP and visibly pulsing vain in patient's neck. They would like a call back to see if they should order any tests for patient or be scheduled in office. Please advise.

## 2023-04-21 NOTE — Progress Notes (Signed)
72 y.o. G2P1 Married White or Caucasian female here for breast and pelvic exam.  I am also following her for HRT use.  Takes lowest dose prempro every other day and does not want to stop.  Has tried and cannot get any lower without worsening symptoms.  Still does have some hot flashes but these are manageable.    Unrelated, her blood pressure is quite elevated today and upon entry into the exam room, she has a very noticeable/prominent right anterior cervical pulsation, likely the vein on top of her carotid artery.  This is not painful to her.  No chest pain.  No SOB but she is having a lot more fatigue in the last few months.  She thought this was due to age.  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    Denies vaginal bleeding.  Patient's last menstrual period was 05/19/1990.          Sexually active: No.  H/O STD:  no  Health Maintenance: PCP:  Dr. Fara Chute.  Last wellness appt was 07/2022.  Did blood work at that appt:   Vaccines are up to date:  yes Colonoscopy:  11/10/2012 MMG:  03/18/2023 Negative BMD:  10/01/2022 Osteopenia Last pap smear:  12/09/2018 Negative.     reports that she has never smoked. She has never used smokeless tobacco. She reports that she does not drink alcohol and does not use drugs.  Past Medical History:  Diagnosis Date   Atypical moles    GERD (gastroesophageal reflux disease)    Hypertension    Melanoma ([facility]) 1998   Mixed incontinence    Osteopenia     Past Surgical History:  Procedure Laterality Date   APPENDECTOMY  1965   BREAST BIOPSY Left 2011   fibrocystic changes   CESAREAN SECTION     excision of melanoma  1998   Dr Ignacia Bayley   TONSILLECTOMY  1955   TUBAL LIGATION      Current Outpatient Medications  Medication Sig Dispense Refill   b complex vitamins tablet Take 1 tablet by mouth daily.     fexofenadine (ALLEGRA) 180 MG tablet Take 180 mg by mouth daily.     losartan (COZAAR) 50 MG tablet Take 1 tablet (50 mg total) by mouth  daily. 90 tablet 1   metoprolol succinate (TOPROL-XL) 25 MG 24 hr tablet TAKE 1 TABLET BY MOUTH EVERY DAY 90 tablet 1   omeprazole (PRILOSEC) 40 MG capsule Take 1 capsule (40 mg total) by mouth 2 (two) times a week. 30 capsule 3   estrogen, conjugated,-medroxyprogesterone (PREMPRO) 0.3-1.5 MG tablet Take 1 tablet by mouth daily. 28 tablet 12   Vitamin D, Ergocalciferol, (DRISDOL) 1.25 MG (50000 UNIT) CAPS capsule Take 1 capsule (50,000 Units total) by mouth every 7 (seven) days. 12 capsule 4   No current facility-administered medications for this visit.    Family History  Problem Relation Age of Onset   Hypertension Mother    Kidney disease Mother    Transient ischemic attack Mother    Lung cancer Father    Lung cancer Brother    Heart disease Maternal Uncle    Melanoma Brother     Review of Systems  Constitutional: Negative.   Genitourinary: Negative.     Exam:   BP (!) 190/92 Comment: manual  Pulse 64   Ht 5\' 2"  (1.575 m)   Wt 127 lb 6.4 oz (57.8 kg)   LMP 05/19/1990   BMI 23.30 kg/m   Height: 5'  2" (157.5 cm)  General appearance: alert, cooperative and appears stated age Neck:  no masses, jugular vein distension and prominence of pulse, no bruit Breasts: normal appearance, no masses or tenderness Abdomen: soft, non-tender; bowel sounds normal; no masses,  no organomegaly Lymph nodes: Cervical, supraclavicular, and axillary nodes normal.  No abnormal inguinal nodes palpated Neurologic: Grossly normal  Pelvic: Declined today by pt.  Chaperone, Ina Homes, CMA, was present for exam.  Assessment/Plan: 1. Encntr for gyn exam (general) (routine) w/o abn findings - Pap smear 2020.  Guidelines reviewed.  Not obtained today. - Mammogram 03/18/2023 - Colonoscopy 11/10/2012 - Bone mineral density 10/01/2022 - lab work done with PCP, Dr. Neita Carp - vaccines reviewed/updated  2. Colon cancer screening - She declined another colonoscopy but willing to do cologuard.  Order  placed for this today.    3. Hormone replacement therapy (HRT) - pt desires to continue.  Aware of risks including breast cancer, DVT/PE/MI/stroke - estrogen, conjugated,-medroxyprogesterone (PREMPRO) 0.3-1.5 MG tablet; Take 1 tablet by mouth daily.  Dispense: 28 tablet; Refill: 12  4. Vitamin D deficiency - Vitamin D, Ergocalciferol, (DRISDOL) 1.25 MG (50000 UNIT) CAPS capsule; Take 1 capsule (50,000 Units total) by mouth every 7 (seven) days.  Dispense: 12 capsule; Refill: 4 - VITAMIN D 25 Hydroxy (Vit-D Deficiency, Fractures)  5. Jugular vein aneurysm - we called her cardiologist, Dr. Verna Czech office and appt made for tomorrow at 11:00am.    More than 30 minutes spent in coordination of care regarding pt's prominent jugular vein and need for possible additional evaluation.

## 2023-04-22 ENCOUNTER — Encounter: Payer: Self-pay | Admitting: Family Medicine

## 2023-04-22 ENCOUNTER — Ambulatory Visit: Payer: Medicare Other | Attending: Nurse Practitioner | Admitting: Nurse Practitioner

## 2023-04-22 ENCOUNTER — Encounter: Payer: Self-pay | Admitting: Nurse Practitioner

## 2023-04-22 VITALS — BP 130/90 | HR 76 | Ht 63.0 in | Wt 127.2 lb

## 2023-04-22 DIAGNOSIS — R002 Palpitations: Secondary | ICD-10-CM | POA: Insufficient documentation

## 2023-04-22 DIAGNOSIS — R5383 Other fatigue: Secondary | ICD-10-CM

## 2023-04-22 DIAGNOSIS — I10 Essential (primary) hypertension: Secondary | ICD-10-CM | POA: Insufficient documentation

## 2023-04-22 DIAGNOSIS — R5382 Chronic fatigue, unspecified: Secondary | ICD-10-CM | POA: Insufficient documentation

## 2023-04-22 DIAGNOSIS — R0989 Other specified symptoms and signs involving the circulatory and respiratory systems: Secondary | ICD-10-CM | POA: Insufficient documentation

## 2023-04-22 LAB — VITAMIN D 25 HYDROXY (VIT D DEFICIENCY, FRACTURES): Vit D, 25-Hydroxy: 43.1 ng/mL (ref 30.0–100.0)

## 2023-04-22 MED ORDER — BLOOD PRESSURE MONITOR DEVI: 1.0000 | Freq: Every day | 0 refills | Status: AC

## 2023-04-22 NOTE — Progress Notes (Signed)
  Cardiology Office Note:  .   Date:  04/22/2023 ID:  Jamie Duke, DOB 1950/10/29, MRN [id number] PCP: Estanislado Pandy, MD  [facility] Providers Cardiologist:  Dina Rich, MD    History of Present Illness: Jamie Duke is a 72 y.o. female with a PMH of palpitations, hypertension, GERD, who presents today for scheduled follow-up.  Previous event monitor was benign.  Last seen by Dr. Dina Rich on June 20, 2022.  Blood pressure was above goal at the time, losartan was increased to 50 mg daily.  Denied any palpitations.  I was recently contacted by her OB/GYN, Dr. Valentina Shaggy, who saw her for routine follow-up yesterday.  Blood pressure was above goal at that office visit and Dr. Hyacinth Meeker saw a noticeable/prominent right anterior cervical pulsation that was felt to be the vein on top of her carotid artery, patient denied any chest pain/shortness of breath or pain associated with this.  She did notice more fatigue in the past few months.  Dr. Hyacinth Meeker recommended that she be evaluated by our office for further workup.  Today she presents for follow-up.  She states she is doing well. Does endorse chronic fatigue. Denies any any cardiovascular acute complaints. Denies chest pain, shortness of breath, palpitations, syncope, presyncope, dizziness, orthopnea, PND, swelling or significant weight changes, acute bleeding, or claudication.   ROS: Negative. See HPI.   Studies Reviewed: .    Cardiac monitor 06/2018: 21 day event monitor Min HR 61, Max HR 129, Avg HR 79 No symptoms reported Telemetry shows normal sinus rhythm, no significant arrhythmias  Physical Exam:   VS:  BP (!) 130/90 (BP Location: Right Arm)   Pulse 76   Ht 5\' 3"  (1.6 m)   Wt 127 lb 3.2 oz (57.7 kg)   LMP 05/19/1990   SpO2 97%   BMI 22.53 kg/m    Wt Readings from Last 3 Encounters:  04/22/23 127 lb 3.2 oz (57.7 kg)  04/21/23 127 lb 6.4 oz (57.8 kg)  06/20/22 128 lb 9.6 oz (58.3 kg)    GEN:  Well nourished, well developed in no acute distress NECK: Enlarged jugular vein visible during inspection to right anterior neck pulsating, no JVD to left side of neck; No carotid bruits CARDIAC: S1/S2, RRR, no murmurs, rubs, gallops RESPIRATORY:  Clear to auscultation without rales, wheezing or rhonchi  ABDOMEN: Soft, non-tender, non-distended EXTREMITIES:  No edema; No deformity   ASSESSMENT AND PLAN: .    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 Will arrange carotid duplex for further evaluation as well as Echocardiogram to evaluate for any structural heart abnormalities. Continue current medication regimen. Continue to follow with PCP.  Palpitations Denies any palpitations or tachycardia. Continue Toprol XL. Heart healthy diet and regular cardiovascular exercise encouraged. Continue to follow with PCP.  HTN  SBP is borderline elevated. Discussed to monitor BP at home at least 2 hours after medications and sitting for 5-10 minutes. Discussed SBP goal < 130. Will provide Rx for BP cuff. Discussed when to notify our office. No medication changes at this time. Heart healthy diet and regular cardiovascular exercise encouraged. Continue to follow with PCP.  Chronic fatigue Etiology multifactorial. Arranging workup as mentioned above. No medication changes at this time. Continue to follow with PCP and rest of care team.    Dispo: Follow-up with me/APP in 3 months or sooner if anything changes.   Signed, [name], NP

## 2023-04-22 NOTE — Patient Instructions (Addendum)
Medication Instructions:  Your physician recommends that you continue on your current medications as directed. Please refer to the Current Medication list given to you today.  Labwork: None   Testing/Procedures: Your physician has requested that you have an echocardiogram. Echocardiography is a painless test that uses sound waves to create images of your heart. It provides your doctor with information about the size and shape of your heart and how well your heart's chambers and valves are working. This procedure takes approximately one hour. There are no restrictions for this procedure. Please do NOT wear cologne, perfume, aftershave, or lotions (deodorant is allowed). Please arrive 15 minutes prior to your appointment time.  Please note: We ask at that you not bring children with you during ultrasound (echo/ vascular) testing. Due to room size and safety concerns, children are not allowed in the ultrasound rooms during exams. Our front office staff cannot provide observation of children in our lobby area while testing is being conducted. An adult accompanying a patient to their appointment will only be allowed in the ultrasound room at the discretion of the ultrasound technician under special circumstances. We apologize for any inconvenience.  Follow-Up: Your physician recommends that you schedule a follow-up appointment in: 3 months   Any Other Special Instructions Will Be Listed Below (If Applicable). Adult Size large OMRON ELECTRIC BLOOD PRESSURE CUFF   If you need a refill on your cardiac medications before your next appointment, please call your pharmacy.

## 2023-04-22 NOTE — Telephone Encounter (Signed)
Patient is scheduled for an appointment today @ 11:00

## 2023-04-23 ENCOUNTER — Ambulatory Visit (HOSPITAL_BASED_OUTPATIENT_CLINIC_OR_DEPARTMENT_OTHER): Payer: Medicare Other | Admitting: Obstetrics & Gynecology

## 2023-04-29 DIAGNOSIS — Z1211 Encounter for screening for malignant neoplasm of colon: Secondary | ICD-10-CM | POA: Diagnosis not present

## 2023-04-30 ENCOUNTER — Ambulatory Visit: Payer: Medicare Other | Attending: Cardiology

## 2023-04-30 ENCOUNTER — Telehealth: Payer: Self-pay | Admitting: Cardiology

## 2023-04-30 DIAGNOSIS — R002 Palpitations: Secondary | ICD-10-CM | POA: Insufficient documentation

## 2023-04-30 DIAGNOSIS — I10 Essential (primary) hypertension: Secondary | ICD-10-CM | POA: Diagnosis not present

## 2023-04-30 DIAGNOSIS — R5382 Chronic fatigue, unspecified: Secondary | ICD-10-CM | POA: Diagnosis not present

## 2023-04-30 DIAGNOSIS — R0989 Other specified symptoms and signs involving the circulatory and respiratory systems: Secondary | ICD-10-CM | POA: Diagnosis not present

## 2023-04-30 NOTE — Telephone Encounter (Signed)
Pt c/o BP issue: STAT if pt c/o blurred vision, one-sided weakness or slurred speech  1. What are your last 5 BP readings? 174/84 in the office (just had an Echo)  2. Are you having any other symptoms (ex. Dizziness, headache, blurred vision, passed out)? FATIGUE  3. What is your BP issue? Patient states that her BP continues to stay high.

## 2023-04-30 NOTE — Telephone Encounter (Signed)
Patient states her average BP has been around 160/95, she does not have readings to give to me. Patient is going to monitor BP and write it down daily and call us Monday and give [place] those readings. Patient states she feels normal she is just tired constantly  Patient had echo done this morning. For some reason carotid doppler was never scheduled so I will send this for scheduling for first available

## 2023-04-30 NOTE — Telephone Encounter (Signed)
Spot was filled before scheduling this patient, will contact and get more information

## 2023-04-30 NOTE — Telephone Encounter (Signed)
Spoke with peck she stated she is okay with putting patient on her schedule today at 10 AM to address this

## 2023-05-02 LAB — ECHOCARDIOGRAM COMPLETE
AR max vel: 2.26 cm2
AV Area VTI: 2.35 cm2
AV Area mean vel: 2.37 cm2
AV Mean grad: 5 mm[Hg]
AV Peak grad: 7.7 mm[Hg]
Ao pk vel: 1.39 m/s
Area-P 1/2: 2.9 cm2
Calc EF: 60 %
MV VTI: 2.3 cm2
S' Lateral: 2.1 cm
Single Plane A2C EF: 63.5 %
Single Plane A4C EF: 53.7 %

## 2023-05-04 ENCOUNTER — Telehealth: Payer: Self-pay | Admitting: Nurse Practitioner

## 2023-05-04 DIAGNOSIS — R002 Palpitations: Secondary | ICD-10-CM

## 2023-05-04 DIAGNOSIS — I10 Essential (primary) hypertension: Secondary | ICD-10-CM

## 2023-05-04 DIAGNOSIS — R5382 Chronic fatigue, unspecified: Secondary | ICD-10-CM

## 2023-05-04 DIAGNOSIS — R0989 Other specified symptoms and signs involving the circulatory and respiratory systems: Secondary | ICD-10-CM

## 2023-05-04 DIAGNOSIS — Z79899 Other long term (current) drug therapy: Secondary | ICD-10-CM

## 2023-05-04 MED ORDER — LOSARTAN POTASSIUM 100 MG PO TABS: 100.0000 mg | ORAL_TABLET | Freq: Every day | ORAL | 1 refills | Status: DC

## 2023-05-04 NOTE — Telephone Encounter (Signed)
-----   Message from [facility] sent at 05/04/2023  1:49 PM EST ----- Please increase Losartan to 100 mg daily and repeat BMET in 1 week.   Thanks!   Best,  [name], NP ----- Message ----- From: Sharen Hones Sent: 05/04/2023   1:38 PM EST To: [name], NP  Please advise

## 2023-05-04 NOTE — Telephone Encounter (Signed)
Patient informed and verbalized understanding of plan. 

## 2023-05-04 NOTE — Telephone Encounter (Signed)
Patient calling to inform us on her BP readings  Friday-164/103 HR- 66 Saturday- 172/103  HR-64 Sunday- 174/105 HR-67 NFAOZ*308/65    HR-69

## 2023-05-07 ENCOUNTER — Ambulatory Visit: Payer: Medicare Other | Attending: Nurse Practitioner

## 2023-05-07 ENCOUNTER — Encounter: Payer: Self-pay | Admitting: Nurse Practitioner

## 2023-05-07 DIAGNOSIS — R0989 Other specified symptoms and signs involving the circulatory and respiratory systems: Secondary | ICD-10-CM | POA: Insufficient documentation

## 2023-05-07 LAB — COLOGUARD: COLOGUARD: NEGATIVE

## 2023-05-11 DIAGNOSIS — R5382 Chronic fatigue, unspecified: Secondary | ICD-10-CM | POA: Diagnosis not present

## 2023-05-11 DIAGNOSIS — Z79899 Other long term (current) drug therapy: Secondary | ICD-10-CM | POA: Diagnosis not present

## 2023-05-21 ENCOUNTER — Telehealth: Payer: Self-pay | Admitting: Cardiology

## 2023-05-21 NOTE — Telephone Encounter (Signed)
 Pt is calling to get her test results.

## 2023-05-21 NOTE — Telephone Encounter (Signed)
 Advised patient results have not been resulted yet patient also concerned with her BP   Monday-162/92 HR 65 Tuesday- 158/100 HR 66 Wednesday- 163/100 HR 68  Today- 150/91 HR 66  Patient is taking all medications as prescribed patient states she took 1 extra 25 Mg tablet on a day after her BP was 175/113  After taking the extra medication her BP was 130/84 Bottom number she states has been consistently over 100  No SOB, Or dizziness just a small headache and is tired  Previous BP in office was 130/ 90

## 2023-05-22 MED ORDER — METOPROLOL SUCCINATE ER 25 MG PO TB24: 25.0000 mg | ORAL_TABLET | Freq: Two times a day (BID) | ORAL | 1 refills | Status: DC

## 2023-05-22 NOTE — Telephone Encounter (Signed)
 Patient informed and verbalized understanding of plan.

## 2023-06-22 ENCOUNTER — Ambulatory Visit: Payer: Medicare Other | Attending: Internal Medicine | Admitting: *Deleted

## 2023-06-22 ENCOUNTER — Encounter: Payer: Self-pay | Admitting: Nurse Practitioner

## 2023-06-22 DIAGNOSIS — Z0001 Encounter for general adult medical examination with abnormal findings: Secondary | ICD-10-CM | POA: Diagnosis not present

## 2023-06-22 DIAGNOSIS — R002 Palpitations: Secondary | ICD-10-CM | POA: Diagnosis not present

## 2023-06-22 DIAGNOSIS — Z6821 Body mass index (BMI) 21.0-21.9, adult: Secondary | ICD-10-CM | POA: Diagnosis not present

## 2023-06-22 DIAGNOSIS — E782 Mixed hyperlipidemia: Secondary | ICD-10-CM | POA: Diagnosis not present

## 2023-06-22 DIAGNOSIS — I10 Essential (primary) hypertension: Secondary | ICD-10-CM | POA: Diagnosis not present

## 2023-06-22 DIAGNOSIS — E7849 Other hyperlipidemia: Secondary | ICD-10-CM | POA: Diagnosis not present

## 2023-06-22 NOTE — Progress Notes (Signed)
Patient in office for nurse BP check.   164/84  68  98%  See BP log scanned into epic.   States that she has taken her morning meds.

## 2023-06-30 ENCOUNTER — Encounter: Payer: Self-pay | Admitting: *Deleted

## 2023-06-30 NOTE — Progress Notes (Signed)
Notified via mychart.

## 2023-07-01 ENCOUNTER — Other Ambulatory Visit: Payer: Self-pay | Admitting: *Deleted

## 2023-07-01 DIAGNOSIS — Z79899 Other long term (current) drug therapy: Secondary | ICD-10-CM

## 2023-07-01 DIAGNOSIS — I10 Essential (primary) hypertension: Secondary | ICD-10-CM

## 2023-07-01 MED ORDER — VALSARTAN 160 MG PO TABS: 160.0000 mg | ORAL_TABLET | Freq: Every day | ORAL | 6 refills | Status: DC

## 2023-07-15 DIAGNOSIS — Z79899 Other long term (current) drug therapy: Secondary | ICD-10-CM | POA: Diagnosis not present

## 2023-07-15 DIAGNOSIS — I10 Essential (primary) hypertension: Secondary | ICD-10-CM | POA: Diagnosis not present

## 2023-07-23 ENCOUNTER — Ambulatory Visit: Payer: Medicare Other | Attending: Nurse Practitioner | Admitting: Nurse Practitioner

## 2023-07-23 ENCOUNTER — Encounter: Payer: Self-pay | Admitting: Nurse Practitioner

## 2023-07-23 VITALS — BP 128/72 | HR 72 | Ht 64.0 in | Wt 126.2 lb

## 2023-07-23 DIAGNOSIS — R0989 Other specified symptoms and signs involving the circulatory and respiratory systems: Secondary | ICD-10-CM | POA: Diagnosis not present

## 2023-07-23 DIAGNOSIS — I10 Essential (primary) hypertension: Secondary | ICD-10-CM | POA: Diagnosis not present

## 2023-07-23 DIAGNOSIS — R002 Palpitations: Secondary | ICD-10-CM | POA: Diagnosis not present

## 2023-07-23 MED ORDER — METOPROLOL SUCCINATE ER 50 MG PO TB24: 50.0000 mg | ORAL_TABLET | Freq: Every day | ORAL | 1 refills | Status: AC

## 2023-07-23 MED ORDER — VALSARTAN 160 MG PO TABS: 160.0000 mg | ORAL_TABLET | Freq: Every day | ORAL | 6 refills | Status: AC

## 2023-07-23 NOTE — Progress Notes (Signed)
 Cardiology Office Note:  .   Date:  07/23/2023 ID:  Jamie Duke, DOB June 07, 1950, MRN [id number] PCP: Estanislado Pandy, MD  [facility] Providers Cardiologist:  Dina Rich, MD    History of Present Illness: Jamie Duke is a 73 y.o. female with a PMH of palpitations, hypertension, GERD, who presents today for scheduled follow-up.  Previous event monitor was benign.  Last seen by Dr. Dina Rich on June 20, 2022.  Blood pressure was above goal at the time, losartan was increased to 50 mg daily.  Denied any palpitations.   I was contacted by her OB/GYN, Dr. Valentina Shaggy, who saw her for routine follow-up in December 2024.  Blood pressure was above goal at that office visit and Dr. Hyacinth Meeker saw a noticeable/prominent right anterior cervical pulsation that was felt to be the vein on top of her carotid artery, patient denied any chest pain/shortness of breath or pain associated with this.  She did notice more fatigue in the past few months.  Dr. Hyacinth Meeker recommended that she be evaluated by our office for further workup.  04/22/2023-today she presents for follow-up.  She states she is doing well. Does endorse chronic fatigue. Denies any any cardiovascular acute complaints. Denies chest pain, shortness of breath, palpitations, syncope, presyncope, dizziness, orthopnea, PND, swelling or significant weight changes, acute bleeding, or claudication.   07/24/2023 - Today she presents for follow-up.  Presents to me her blood pressure log that reveals some elevated BP readings.  SBP averaging from upper 130s to low 160s.  Blood pressure today in office is well-controlled. Denies any chest pain, shortness of breath, palpitations, syncope, presyncope, dizziness, orthopnea, PND, swelling or significant weight changes, acute bleeding, or claudication.  She would also like to come off some of her medications if at all possible/decrease her pill burden.  ROS: Negative. See HPI.   Studies  Reviewed: Marland Kitchen    EKG: EKG is not ordered today.  Carotid duplex 04/2023: Summary:  Right Carotid: Velocities in the right ICA are consistent with a 1-39%  stenosis. Non-hemodynamically significant plaque <50% noted in the  CCA. The ECA appears <50% stenosed.   Left Carotid: Velocities in the left ICA are consistent with a 1-39%  stenosis. Non-hemodynamically significant plaque <50% noted in the  CCA. The ECA appears <50% stenosed.   Vertebrals:  Bilateral vertebral arteries demonstrate antegrade flow.  Subclavians: Normal flow hemodynamics were seen in bilateral subclavian arteries.    Echocardiogram 04/2023: 1. Left ventricular ejection fraction, by estimation, is 65 to 70%. Left  ventricular ejection fraction by 3D volume is 67 %. The left ventricle has  normal function. The left ventricle has no regional wall motion  abnormalities. There is mild left  ventricular hypertrophy. Left ventricular diastolic parameters are  consistent with Grade I diastolic dysfunction (impaired relaxation).   2. Right ventricular systolic function is normal. The right ventricular  size is normal. There is normal pulmonary artery systolic pressure. The  estimated right ventricular systolic pressure is 25.8 mmHg.   3. The mitral valve is normal in structure. Trivial mitral valve  regurgitation. No evidence of mitral stenosis.   4. The aortic valve is tricuspid. Aortic valve regurgitation is not  visualized. No aortic stenosis is present.   5. The inferior vena cava is normal in size with greater than 50%  respiratory variability, suggesting right atrial pressure of 3 mmHg.   Cardiac monitor 06/2018: 21 day event monitor Min HR 61, Max HR 129,  Avg HR 79 No symptoms reported Telemetry shows normal sinus rhythm, no significant arrhythmias  Physical Exam:   VS:  BP 128/72   Pulse 72   Ht 5\' 4"  (1.626 m)   Wt 126 lb 3.2 oz (57.2 kg)   LMP 05/19/1990   SpO2 98%   BMI 21.66 kg/m    Wt Readings from  Last 3 Encounters:  07/23/23 126 lb 3.2 oz (57.2 kg)  04/22/23 127 lb 3.2 oz (57.7 kg)  04/21/23 127 lb 6.4 oz (57.8 kg)    GEN: Well nourished, well developed in no acute distress NECK: Enlarged jugular vein visible during inspection to right anterior neck pulsating, no JVD to left side of neck; No carotid bruits CARDIAC: S1/S2, RRR, no murmurs, rubs, gallops RESPIRATORY:  Clear to auscultation without rales, wheezing or rhonchi  ABDOMEN: Soft, non-tender, non-distended EXTREMITIES:  No edema; No deformity   ASSESSMENT AND PLAN: .    HTN  SBP is elevated at home, well controlled today. Discussed to monitor BP at home at least 2 hours after medications and sitting for 5-10 minutes. Discussed SBP goal < 140. Discussed when to notify our office.  Will stop metoprolol succinate 25 mg twice daily and change to metoprolol succinate 50 mg daily to decrease pill burden.  Will bring her back in 2 to 3 weeks for nurse visit to check her BP.  If BP is still not at goal, plan to increase valsartan.  Will provide refills per her request. Heart healthy diet and regular cardiovascular exercise encouraged. Continue to follow with PCP.  Palpitations Denies any palpitations or tachycardia. Continue Toprol XL. Heart healthy diet and regular cardiovascular exercise encouraged. Continue to follow with PCP.  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  Echocardiogram performed in December 2024 was overall benign.  Continue current medication regimen. Continue to follow with PCP.  Will consult attending cardiologist to see if CT angio should be pursued with contrast.    Dispo: Follow-up with Dr. Dina Rich or APP in 6 months or sooner if anything changes.   Signed, [name], NP

## 2023-07-23 NOTE — Patient Instructions (Addendum)
 Medication Instructions:  Your physician has recommended you make the following change in your medication:  Please change Metoprolol Succinate to 50 Mg in the morning   Labwork: None   Testing/Procedures: None   Follow-Up: Your physician recommends that you schedule a follow-up appointment in:  6 months  2-3 week nurse visit    Any Other Special Instructions Will Be Listed Below (If Applicable).  If you need a refill on your cardiac medications before your next appointment, please call your pharmacy.

## 2023-08-06 ENCOUNTER — Ambulatory Visit: Attending: Internal Medicine

## 2023-08-06 VITALS — BP 126/84 | HR 69

## 2023-08-06 DIAGNOSIS — I10 Essential (primary) hypertension: Secondary | ICD-10-CM | POA: Diagnosis not present

## 2023-08-06 NOTE — Progress Notes (Signed)
 BP looks very good and at goal. I am pleased to hear this. No changes to treatment plan. Follow-up as scheduled.   Thanks!   Best, [name], NP

## 2023-08-06 NOTE — Progress Notes (Signed)
 Patient in today for BP check. Reports no Chest pains, SOB and Dizziness or Headaches. No medication changes. Stated that Metoprolol was working for her. No changes in BP. Advised her that will send over to provider.

## 2023-08-18 ENCOUNTER — Encounter: Payer: Self-pay | Admitting: Vascular Surgery

## 2023-08-18 ENCOUNTER — Ambulatory Visit (INDEPENDENT_AMBULATORY_CARE_PROVIDER_SITE_OTHER): Admitting: Vascular Surgery

## 2023-08-18 VITALS — BP 180/93 | HR 65 | Temp 98.6°F | Resp 18 | Ht 64.0 in | Wt 127.0 lb

## 2023-08-18 DIAGNOSIS — R0989 Other specified symptoms and signs involving the circulatory and respiratory systems: Secondary | ICD-10-CM | POA: Insufficient documentation

## 2023-08-18 NOTE — Progress Notes (Signed)
 Patient name: Jamie Duke MRN: [id number] DOB: 1950-11-20 Sex: female  REASON FOR CONSULT: Jugular aneurysm  HPI: Jamie Duke is a 73 y.o. 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  Patient is referred here by [name] with cardiology.  Patient states she was initially unclear on the referral.  States she has an area on her neck where she can feel a pulse.  No history of stroke or TIA.  Did have carotid ultrasounds last year showing 1 to 39% stenosis bilaterally.  Past Medical History:  Diagnosis Date   Atypical moles    GERD (gastroesophageal reflux disease)    Hypertension    Melanoma (HCC) 1998   Mixed incontinence    Osteopenia     Past Surgical History:  Procedure Laterality Date   APPENDECTOMY  1965   BREAST BIOPSY Left 2011   fibrocystic changes   CESAREAN SECTION     excision of melanoma  1998   Dr Ignacia Bayley   TONSILLECTOMY  1955   TUBAL LIGATION      Family History  Problem Relation Age of Onset   Hypertension Mother    Kidney disease Mother    Transient ischemic attack Mother    Lung cancer Father    Lung cancer Brother    Heart disease Maternal Uncle    Melanoma Brother     SOCIAL HISTORY: Social History   Socioeconomic History   Marital status: Married    Spouse name: Not on file   Number of children: 1   Years of education: Not on file   Highest education level: Not on file  Occupational History   Occupation: Public affairs consultant  Tobacco Use   Smoking status: Never   Smokeless tobacco: Never  Substance and Sexual Activity   Alcohol use: No   Drug use: No   Sexual activity: Not Currently    Birth control/protection: Surgical    Comment: btl  Other Topics Concern   Not on file  Social History Narrative   Married, 1 son   Owns and operates Nurse, 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     Blood Pressure Monitor DEVI 1 each by Does not apply route daily. 1 each 0   estrogen, conjugated,-medroxyprogesterone (PREMPRO) 0.3-1.5 MG tablet Take 1 tablet by mouth daily. 28 tablet 12   fexofenadine (ALLEGRA) 180 MG tablet Take 180 mg by mouth daily.     metoprolol succinate (TOPROL-XL) 50 MG 24 hr tablet Take 1 tablet (50 mg total) by mouth daily. 90 tablet 1   omeprazole (PRILOSEC) 40 MG capsule Take 1 capsule (40 mg total) by mouth 2 (two) times a week. 30 capsule 3   valsartan (DIOVAN) 160 MG tablet Take 1 tablet (160 mg total) by mouth daily. 30 tablet 6   Vitamin D, Ergocalciferol, (DRISDOL) 1.25 MG (50000 UNIT) CAPS capsule Take 1 capsule (50,000 Units total) by mouth every 7 (seven) days. 12 capsule 4  No current facility-administered medications for this visit.    REVIEW OF SYSTEMS:  [X]  denotes positive finding, [ ]  denotes negative finding Cardiac  Comments:  Chest pain or chest pressure:    Shortness of breath upon exertion:    Short of breath when lying flat:    Irregular heart rhythm:        Vascular    Pain in calf, thigh, or hip brought on by ambulation:    Pain in feet at night that wakes you up from your sleep:     Blood clot in your veins:    Leg swelling:         Pulmonary    Oxygen at home:    Productive cough:     Wheezing:         Neurologic    Sudden weakness in arms or legs:     Sudden numbness in arms or legs:     Sudden onset of difficulty speaking or slurred speech:    Temporary loss of vision in one eye:     Problems with dizziness:         Gastrointestinal    Blood in stool:     Vomited blood:          Genitourinary    Burning when urinating:     Blood in urine:        Psychiatric    Major depression:         Hematologic    Bleeding problems:    Problems with blood clotting too easily:        Skin    Rashes or ulcers:        Constitutional    Fever or chills:      PHYSICAL EXAM: Vitals:   08/18/23 1531  BP: (!) 180/93  Pulse: 65  Resp: 18  Temp: 98.6 F (37 C)  TempSrc: Oral  SpO2: 99%  Weight: 127 lb (57.6 kg)  Height: 5\' 4"  (1.626 m)    GENERAL: The patient is a well-nourished female, in no acute distress. The vital signs are documented above. CARDIAC: There is a regular rate and rhythm.  VASCULAR:  Radial pulse palpable bilateral upper extremities Palpable carotid pulse on the right just above her clavicle PULMONARY: No respiratory distress. ABDOMEN: Soft and non-tender . MUSCULOSKELETAL: There are no major deformities or cyanosis. NEUROLOGIC: No focal weakness or paresthesias are detected. PSYCHIATRIC: The patient has a normal affect.  DATA:   Carotid ultrasound 05/07/2023 with 1 to 39% stenosis bilaterally  Assessment/Plan:  73 y.o. 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  She is referred by [name] with cardiology.  I evaluated the area of concern and this is her carotid pulse just above her right clavicle.  Discussed this is just an anatomic variant where she has tortuosity in the vessel and in her case the vessel is superficial under the skin.  Sometimes there is a high takeoff of the carotid from the innominate as well and is an anatomic variant.  I would not pursue any further workup including I do not think any indication for CT imaging.  She had a prior carotid ultrasound showing no significant carotid disease.  She can follow-up with me as needed.   Cephus Shelling, MD [facility] Office: [phone]

## 2023-09-14 DIAGNOSIS — H2513 Age-related nuclear cataract, bilateral: Secondary | ICD-10-CM | POA: Diagnosis not present

## 2023-09-14 DIAGNOSIS — H524 Presbyopia: Secondary | ICD-10-CM | POA: Diagnosis not present

## 2023-10-06 DIAGNOSIS — L821 Other seborrheic keratosis: Secondary | ICD-10-CM | POA: Diagnosis not present

## 2023-10-06 DIAGNOSIS — I781 Nevus, non-neoplastic: Secondary | ICD-10-CM | POA: Diagnosis not present

## 2023-10-06 DIAGNOSIS — L309 Dermatitis, unspecified: Secondary | ICD-10-CM | POA: Diagnosis not present

## 2023-11-07 ENCOUNTER — Other Ambulatory Visit: Payer: Self-pay | Admitting: Nurse Practitioner

## 2024-01-02 ENCOUNTER — Other Ambulatory Visit: Payer: Self-pay | Admitting: Nurse Practitioner

## 2024-01-08 DIAGNOSIS — I10 Essential (primary) hypertension: Secondary | ICD-10-CM | POA: Diagnosis not present

## 2024-01-08 DIAGNOSIS — Z6821 Body mass index (BMI) 21.0-21.9, adult: Secondary | ICD-10-CM | POA: Diagnosis not present

## 2024-01-08 DIAGNOSIS — R002 Palpitations: Secondary | ICD-10-CM | POA: Diagnosis not present

## 2024-01-08 DIAGNOSIS — E7849 Other hyperlipidemia: Secondary | ICD-10-CM | POA: Diagnosis not present

## 2024-01-08 DIAGNOSIS — E782 Mixed hyperlipidemia: Secondary | ICD-10-CM | POA: Diagnosis not present

## 2024-01-27 DIAGNOSIS — Z0001 Encounter for general adult medical examination with abnormal findings: Secondary | ICD-10-CM | POA: Diagnosis not present

## 2024-01-27 DIAGNOSIS — E7849 Other hyperlipidemia: Secondary | ICD-10-CM | POA: Diagnosis not present

## 2024-01-27 DIAGNOSIS — I10 Essential (primary) hypertension: Secondary | ICD-10-CM | POA: Diagnosis not present

## 2024-02-05 DIAGNOSIS — E7849 Other hyperlipidemia: Secondary | ICD-10-CM | POA: Diagnosis not present

## 2024-02-05 DIAGNOSIS — I10 Essential (primary) hypertension: Secondary | ICD-10-CM | POA: Diagnosis not present

## 2024-02-05 DIAGNOSIS — R002 Palpitations: Secondary | ICD-10-CM | POA: Diagnosis not present

## 2024-02-05 DIAGNOSIS — J309 Allergic rhinitis, unspecified: Secondary | ICD-10-CM | POA: Diagnosis not present

## 2024-02-05 DIAGNOSIS — E782 Mixed hyperlipidemia: Secondary | ICD-10-CM | POA: Diagnosis not present

## 2024-02-05 DIAGNOSIS — Z6821 Body mass index (BMI) 21.0-21.9, adult: Secondary | ICD-10-CM | POA: Diagnosis not present

## 2024-02-05 DIAGNOSIS — Z23 Encounter for immunization: Secondary | ICD-10-CM | POA: Diagnosis not present

## 2024-03-11 ENCOUNTER — Other Ambulatory Visit: Payer: Self-pay | Admitting: Obstetrics & Gynecology

## 2024-03-11 DIAGNOSIS — Z1231 Encounter for screening mammogram for malignant neoplasm of breast: Secondary | ICD-10-CM

## 2024-04-06 ENCOUNTER — Ambulatory Visit
Admission: RE | Admit: 2024-04-06 | Discharge: 2024-04-06 | Disposition: A | Source: Ambulatory Visit | Attending: Obstetrics & Gynecology | Admitting: Obstetrics & Gynecology

## 2024-04-06 DIAGNOSIS — Z1231 Encounter for screening mammogram for malignant neoplasm of breast: Secondary | ICD-10-CM | POA: Diagnosis not present

## 2024-05-13 ENCOUNTER — Other Ambulatory Visit (HOSPITAL_BASED_OUTPATIENT_CLINIC_OR_DEPARTMENT_OTHER): Payer: Self-pay | Admitting: Obstetrics & Gynecology

## 2024-05-13 DIAGNOSIS — E559 Vitamin D deficiency, unspecified: Secondary | ICD-10-CM

## 2024-05-25 ENCOUNTER — Other Ambulatory Visit (HOSPITAL_BASED_OUTPATIENT_CLINIC_OR_DEPARTMENT_OTHER): Payer: Self-pay | Admitting: Obstetrics & Gynecology

## 2024-05-25 DIAGNOSIS — E559 Vitamin D deficiency, unspecified: Secondary | ICD-10-CM

## 2024-05-26 NOTE — Telephone Encounter (Signed)
 Called and spoke with patient about coming in to have Vitamin D  level drawn. Patient will be coming in Next Wednesday to the lab. Patient states she also needs refill on Prempro . TBW

## 2024-06-01 ENCOUNTER — Other Ambulatory Visit (HOSPITAL_BASED_OUTPATIENT_CLINIC_OR_DEPARTMENT_OTHER): Payer: Self-pay

## 2024-06-01 DIAGNOSIS — E559 Vitamin D deficiency, unspecified: Secondary | ICD-10-CM

## 2024-06-02 LAB — VITAMIN D 25 HYDROXY (VIT D DEFICIENCY, FRACTURES): Vit D, 25-Hydroxy: 34.3 ng/mL (ref 30.0–100.0)

## 2024-06-06 ENCOUNTER — Ambulatory Visit (HOSPITAL_BASED_OUTPATIENT_CLINIC_OR_DEPARTMENT_OTHER): Payer: Self-pay | Admitting: Obstetrics & Gynecology

## 2024-06-06 DIAGNOSIS — E559 Vitamin D deficiency, unspecified: Secondary | ICD-10-CM

## 2024-06-06 MED ORDER — VITAMIN D (ERGOCALCIFEROL) 1.25 MG (50000 UNIT) PO CAPS: 50000.0000 [IU] | ORAL_CAPSULE | ORAL | 4 refills | Status: AC
